# Patient Record
Sex: Female | Born: 1980 | Race: White | Hispanic: No | Marital: Single | Smoking: Former smoker
Health system: Southern US, Community
[De-identification: ages and names within clinical notes are randomized; demographics above are authoritative.]

## PROBLEM LIST (undated history)

## (undated) DIAGNOSIS — R51 Headache: Secondary | ICD-10-CM

## (undated) HISTORY — PX: TIBIA FRACTURE SURGERY: SHX806

## (undated) HISTORY — DX: Headache: R51

---

## 2003-01-17 HISTORY — PX: OTHER SURGICAL HISTORY: SHX169

## 2003-09-25 ENCOUNTER — Other Ambulatory Visit: Admission: RE | Admit: 2003-09-25 | Discharge: 2003-09-25 | Payer: Self-pay | Admitting: Obstetrics and Gynecology

## 2003-10-09 ENCOUNTER — Encounter (INDEPENDENT_AMBULATORY_CARE_PROVIDER_SITE_OTHER): Payer: Self-pay | Admitting: Specialist

## 2003-10-09 ENCOUNTER — Encounter (INDEPENDENT_AMBULATORY_CARE_PROVIDER_SITE_OTHER): Payer: Self-pay | Admitting: *Deleted

## 2003-10-09 ENCOUNTER — Ambulatory Visit (HOSPITAL_COMMUNITY): Admission: RE | Admit: 2003-10-09 | Discharge: 2003-10-09 | Payer: Self-pay | Admitting: Obstetrics and Gynecology

## 2004-10-17 ENCOUNTER — Other Ambulatory Visit: Admission: RE | Admit: 2004-10-17 | Discharge: 2004-10-17 | Payer: Self-pay | Admitting: Obstetrics and Gynecology

## 2005-03-29 ENCOUNTER — Emergency Department (HOSPITAL_COMMUNITY): Admission: EM | Admit: 2005-03-29 | Discharge: 2005-03-29 | Payer: Self-pay | Admitting: Emergency Medicine

## 2006-05-09 ENCOUNTER — Observation Stay (HOSPITAL_COMMUNITY): Admission: EM | Admit: 2006-05-09 | Discharge: 2006-05-10 | Payer: Self-pay | Admitting: Emergency Medicine

## 2006-08-21 ENCOUNTER — Encounter: Admission: RE | Admit: 2006-08-21 | Discharge: 2006-08-21 | Payer: Self-pay | Admitting: Orthopedic Surgery

## 2006-09-28 ENCOUNTER — Ambulatory Visit (HOSPITAL_COMMUNITY): Admission: RE | Admit: 2006-09-28 | Discharge: 2006-09-28 | Payer: Self-pay | Admitting: Orthopedic Surgery

## 2006-10-18 ENCOUNTER — Encounter: Admission: RE | Admit: 2006-10-18 | Discharge: 2006-10-29 | Payer: Self-pay | Admitting: Orthopedic Surgery

## 2008-12-25 ENCOUNTER — Ambulatory Visit (HOSPITAL_COMMUNITY): Admission: RE | Admit: 2008-12-25 | Discharge: 2008-12-25 | Payer: Self-pay | Admitting: Internal Medicine

## 2009-09-17 ENCOUNTER — Ambulatory Visit: Payer: Self-pay | Admitting: Family Medicine

## 2009-09-17 DIAGNOSIS — J45909 Unspecified asthma, uncomplicated: Secondary | ICD-10-CM | POA: Insufficient documentation

## 2009-09-17 DIAGNOSIS — R51 Headache: Secondary | ICD-10-CM

## 2009-09-17 DIAGNOSIS — R519 Headache, unspecified: Secondary | ICD-10-CM | POA: Insufficient documentation

## 2009-09-22 ENCOUNTER — Ambulatory Visit: Payer: Self-pay | Admitting: Family Medicine

## 2009-09-22 ENCOUNTER — Other Ambulatory Visit: Admission: RE | Admit: 2009-09-22 | Discharge: 2009-09-22 | Payer: Self-pay | Admitting: Family Medicine

## 2009-09-28 ENCOUNTER — Encounter: Payer: Self-pay | Admitting: Family Medicine

## 2009-09-28 LAB — CONVERTED CEMR LAB: Pap Smear: NEGATIVE

## 2009-10-06 ENCOUNTER — Telehealth: Payer: Self-pay | Admitting: Family Medicine

## 2009-11-15 ENCOUNTER — Telehealth: Payer: Self-pay | Admitting: Family Medicine

## 2009-12-25 ENCOUNTER — Emergency Department (HOSPITAL_COMMUNITY)
Admission: EM | Admit: 2009-12-25 | Discharge: 2009-12-25 | Payer: Self-pay | Source: Home / Self Care | Admitting: Emergency Medicine

## 2010-02-16 NOTE — Progress Notes (Signed)
Summary: REQUEST FOR ALTERNATE MED (To replace Imitrex)  Phone Note Call from Patient   Caller: Patient  [phone] Summary of Call: Pt called to speak with Dr Clent Ridges about medication concerns...Marland KitchenMarland Kitchen Pt adv that she was prescribed med: Imitrex for migraine h/a's but she has taken six of the eight pills since yesterday around 3pm but is still experiencing h/a.... Pt adv that it only dulls the pain, does not get rid of migraine.... Pt would like to know if she can be prescribed something besides Imitrex?  Can she have Rx to help with sxs of nausea associated w/ migraine?...Marland KitchenMarland KitchenLast seen 9/2 for new pt appt / 9/7 for pap only...Marland KitchenMarland KitchenMarland Kitchen [facility] [place] [facility] [place].  Initial call taken by: Debbra Riding,  October 06, 2009 8:35 AM  Follow-up for Phone Call        call in Vicodin 5/500 q 6 hours as needed pain to take in addition to Imitrex, #30 with 2 rf. Also call in Phenergan 25mg  q 4 hours as needed nausea, #30 with 5 rf Follow-up by: Nelwyn Salisbury MD,  October 06, 2009 9:50 AM  Additional Follow-up for Phone Call Additional follow up Details #1::        wants something besides imitrex. how many vicodin.thanks Additional Follow-up by: Pura Spice, RN,  October 06, 2009 11:31 AM    Additional Follow-up for Phone Call Additional follow up Details #2::    call in Maxalt 10 mg as needed , #12 with 11 rf Follow-up by: Nelwyn Salisbury MD,  October 06, 2009 11:34 AM  Additional Follow-up for Phone Call Additional follow up Details #3:: Details for Additional Follow-up Action Taken: done pt notified Additional Follow-up by: Pura Spice, RN,  October 06, 2009 11:43 AM  New/Updated Medications: VICODIN 5-500 MG TABS (HYDROCODONE-ACETAMINOPHEN) 1 by mouth every 6 hrs as needed pain. PROMETHAZINE HCL 25 MG TABS (PROMETHAZINE HCL) 1 by mouth q 4 hrs as needed nausea MAXALT 10 MG  TABS (RIZATRIPTAN BENZOATE) as needed for migraine Prescriptions: VICODIN 5-500 MG TABS  (HYDROCODONE-ACETAMINOPHEN) 1 by mouth every 6 hrs as needed pain.  #30 x 2   Entered by:   Pura Spice, RN   Authorized by:   Nelwyn Salisbury MD   Signed by:   Pura Spice, RN on 10/06/2009   Method used:   Telephoned to ...       [facility] [address] [phone]* (retail)       [address] [place], [place]  [address]       Ph: [phone]       Fax: [phone]   RxID:   [id number] PROMETHAZINE HCL 25 MG TABS (PROMETHAZINE HCL) 1 by mouth q 4 hrs as needed nausea  #30 x 5   Entered by:   Pura Spice, RN   Authorized by:   Nelwyn Salisbury MD   Signed by:   Pura Spice, RN on 10/06/2009   Method used:   Electronically to        [facility] [address] [phone]* (retail)       [address] [place], [place]  [address]       Ph: [phone]       Fax: [phone]   RxID:   [id number] MAXALT 10 MG  TABS (RIZATRIPTAN BENZOATE) as needed for migraine  #12 x 11   Entered by:   Rene Kocher  Marlynn Perking, RN   Authorized by:   Nelwyn Salisbury MD   Signed by:   Pura Spice, RN on 10/06/2009   Method used:   Electronically to        [facility] (retail)       [address]       [place], [place]  [id number]       Ph: [phone]       Fax: [phone]   RxID:   [id number]

## 2010-02-16 NOTE — Assessment & Plan Note (Signed)
Summary: pap only ok per doc/njr   Vital Signs:  Patient profile:   30 year old female BP sitting:   120 / 80  Vitals Entered By: Pura Spice, RN (September 22, 2009 1:24 PM) CC: pap smear only Is Patient Diabetic? No   History of Present Illness: Here to get a Pap smear and pelvic/breast exam. She feels fine today. She has not had a chance to try Sumatriptan yet since she has not had a HA since our visit last week.   Allergies (verified): No Known Drug Allergies  Past History:  Past Medical History: Reviewed history from 09/17/2009 and no changes required. Asthma as a child Headache  Past Surgical History: Reviewed history from 09/17/2009 and no changes required. surgery to repair a left tibial fracture in 2008 per Dr. Myrtie Neither using plates, screws, and bolts surgery to remove a loose screw from the left tibia in 2009 per Dr. 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    Physical Exam  General:  Well-developed,well-nourished,in no acute distress; alert,appropriate and cooperative throughout examination Breasts:  No mass, nodules, thickening, tenderness, bulging, retraction, inflamation, nipple discharge or skin changes noted.   Genitalia:  Pelvic Exam:        External: normal female genitalia without lesions or masses        Vagina: normal without lesions or masses        Cervix: normal without lesions or masses  Adnexa: the right side has no masses or tenderness, the left side is surgically absent        Uterus: normal by palpation        Pap smear: performed   Impression & Recommendations:  Problem # 1:  ROUTINE GYNECOLOGICAL EXAMINATION (ICD-V72.31)  Complete Medication List: 1)  Sumatriptan Succinate 100 Mg Tabs (Sumatriptan succinate) .... As needed  Patient Instructions: 1)  Please schedule a follow-up appointment in 1 year.

## 2010-02-16 NOTE — Progress Notes (Signed)
Summary: REQ FOR Rx TO RESENT  Phone Note Call from Patient   Caller: Patient [phone] Summary of Call: Pt called to adv that she went to the pharmacy and the Rx for medications and the Vicodin was not available and they told her that they didn't have a script for that med - adv LBF would have to send Rx for Vicodin again..... Pt was unable to obtain Maxalt due to insurance requiring a Prior Authorization (fax for same has been recvd)..... Pt req that someone call the pharmacy ([facility] [place] [facility] [place]) and clarify Rx for Vicodin so she can take it till  PA for Maxalt is taken care of.   Initial call taken by: Debbra Riding,  October 06, 2009 2:45 PM  Follow-up for Phone Call        ok [facility] notified which cverbalized they had vicodin and will call pt when ready for pick up. Follow-up by: Pura Spice, RN,  October 06, 2009 3:18 PM

## 2010-02-16 NOTE — Letter (Signed)
Summary: Results Follow-up Letter  [facility]  [address] [place], [place] [address]   Phone: [phone]  Fax: [phone]    09/28/2009  [id number] [place], [place]  [address]  Dear Ms. Fabel,   The following are the results of your recent test(s):  Test     Result     Pap Smear    Normal____yes repeat 1 yr. r  Sincerely,    Dr Kathie Rhodes. Tawanna Sat at [place]

## 2010-02-16 NOTE — Assessment & Plan Note (Signed)
Summary: new to est---ok per dr//ccm   Vital Signs:  Patient profile:   30 year old female Height:      66 inches Weight:      145 pounds BMI:     23.49 BP sitting:   82 / 62  (left arm)  Vitals Entered By: Raechel Ache, RN (September 17, 2009 2:38 PM) CC: New to Establish.   History of Present Illness: 30 yr old female to establish with [place]. She has not seen a primary care physician for many years. She describes her migraine HAs, which have been going on since she was in high school. She averages 1-2 a month, especially around her menses. She gets fuzzy lines in her visual fields, nausea without vomitting, and throbbing frontal HAs. Has used Midrin in the past, now uses Motrin. She has never tried a triptan. They last about 12 hours, and they go away after she goes to sleep. She has not had a Pap smear since her surgery in 2005.   Preventive Screening-Counseling & Management  Alcohol-Tobacco     Smoking Status: current  Caffeine-Diet-Exercise     Does Patient Exercise: no      Drug Use:  yes.    Allergies (verified): No Known Drug Allergies  Past History:  Past Medical History: Asthma as a child Headache  Past Surgical History: surgery to repair a left tibial fracture in 2008 per Dr. Myrtie Neither using plates, screws, and bolts surgery to remove a loose screw from the left tibia in 2009 per Dr. 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 Family History Diabetes 1st degree relative Family History Hypertension Lung CA  Social History: Reviewed history and no changes required. 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[employer], wants to attend law                      school Single Current Smoker Drug use-yes Regular exercise-no Alcohol use-yes Occupation:  employed Smoking Status:  current Drug Use:  yes Does Patient Exercise:  no  Review of Systems  The patient denies anorexia, fever, weight  loss, weight gain, vision loss, decreased hearing, hoarseness, chest pain, syncope, dyspnea on exertion, peripheral edema, prolonged cough, hemoptysis, abdominal pain, melena, hematochezia, severe indigestion/heartburn, hematuria, incontinence, genital sores, muscle weakness, suspicious skin lesions, transient blindness, difficulty walking, depression, unusual weight change, abnormal bleeding, enlarged lymph nodes, angioedema, breast masses, and testicular masses.    Physical Exam  General:  Well-developed,well-nourished,in no acute distress; alert,appropriate and cooperative throughout examination Head:  Normocephalic and atraumatic without obvious abnormalities. No apparent alopecia or balding. Eyes:  No corneal or conjunctival inflammation noted. EOMI. Perrla. Funduscopic exam benign, without hemorrhages, exudates or papilledema. Vision grossly normal. Ears:  External ear exam shows no significant lesions or deformities.  Otoscopic examination reveals clear canals, tympanic membranes are intact bilaterally without bulging, retraction, inflammation or discharge. Hearing is grossly normal bilaterally. Nose:  External nasal examination shows no deformity or inflammation. Nasal mucosa are pink and moist without lesions or exudates. Mouth:  Oral mucosa and oropharynx without lesions or exudates.  Teeth in good repair. Neck:  No deformities, masses, or tenderness noted. Lungs:  Normal respiratory effort, chest expands symmetrically. Lungs are clear to auscultation, no crackles or wheezes. Heart:  Normal rate and regular rhythm. S1 and S2 normal without gallop, murmur, click, rub or other extra sounds. Neurologic:  No cranial nerve deficits noted. Station and  gait are normal. Plantar reflexes are down-going bilaterally. DTRs are symmetrical throughout. Sensory, motor and coordinative functions appear intact.   Impression & Recommendations:  Problem # 1:  HEADACHE (ICD-784.0)  Her updated medication  list for this problem includes:    Sumatriptan Succinate 100 Mg Tabs (Sumatriptan succinate) .Marland Kitchen... As needed  Problem # 2:  ASTHMA (ICD-493.90)  Complete Medication List: 1)  Sumatriptan Succinate 100 Mg Tabs (Sumatriptan succinate) .... As needed  Patient Instructions: 1)  Try Sumatriptan for the migraines. She will retun soon for a pelvic exam and Pap smear.  Prescriptions: SUMATRIPTAN SUCCINATE 100 MG TABS (SUMATRIPTAN SUCCINATE) as needed  #12 x 11   Entered and Authorized by:   Nelwyn Salisbury MD   Signed by:   Nelwyn Salisbury MD on 09/17/2009   Method used:   Electronically to        [facility] (retail)       [address]       [place], [place]  [id number]       Ph: [phone]       Fax: [phone]   RxID:   [id number]

## 2010-02-16 NOTE — Progress Notes (Signed)
Summary: headache & nausea after hit head 2 weeks ago  Phone Note Call from Patient Call back at [facility] Phone [phone]   Summary of Call: Bad headache, going home to get phenergan.  Eyes tearing & she feels she'll throw up.  Hit head 2 weeks ago & never got better.  [facility] tried to sell ice pack but no one saw her.  Not sinus, is behind eye, where she hit head, above eye & eyebrow area.  No dizziness.  Has had trouble thinking today, can't think clearly now.  OV declined  at 3:00 Dr. Abner Greenspan because has class at 3:30.  Didn't pickup Maxalt because #12 was $200, #9 would have been covered by insurance.  She's not in mood to haggle with drugstore.  Cannot come tomorrow, would have to leave no later than 10:30 for a 11:00 class & she can't miss class for any reason she says.  [facility] [address].  NKDA.   Initial call taken by: Rudy Jew, RN,  November 15, 2009 10:56 AM  Follow-up for Phone Call        pt declined ov today and tomorrow but will be seen on wed at 1 pm instructed for her to go to drug store and pick up 9 maxalt tabs  Follow-up by: Pura Spice, RN,  November 15, 2009 11:52 AM

## 2010-02-16 NOTE — Progress Notes (Signed)
Summary: Maxalt 10mg  approved for 9 pills every 23 days per [facility]  Phone Note Outgoing Call Call back at [phone]   Call placed by: Aram Beecham  Call placed to: [facility] Summary of Call: [facility] will only cover #9 pills of Maxalt 10mg  every 23 days. That is the maximum amount that the patient's plan will cover every 23 days. I left message on patient's voicemail to return my call.  Initial call taken by: Warnell Forester,  October 06, 2009 3:38 PM

## 2010-03-29 LAB — URINALYSIS, ROUTINE W REFLEX MICROSCOPIC
Bilirubin Urine: NEGATIVE
Glucose, UA: NEGATIVE mg/dL
Ketones, ur: NEGATIVE mg/dL
Leukocytes, UA: NEGATIVE
Nitrite: NEGATIVE
Protein, ur: NEGATIVE mg/dL
Specific Gravity, Urine: 1.01 (ref 1.005–1.030)
Urobilinogen, UA: 1 mg/dL (ref 0.0–1.0)
pH: 6.5 (ref 5.0–8.0)

## 2010-03-29 LAB — CBC
MCH: 31.4 pg (ref 26.0–34.0)
MCHC: 32.8 g/dL (ref 30.0–36.0)
MCV: 95.6 fL (ref 78.0–100.0)
Platelets: 287 10*3/uL (ref 150–400)
RDW: 12.1 % (ref 11.5–15.5)

## 2010-03-29 LAB — DIFFERENTIAL
Basophils Absolute: 0 10*3/uL (ref 0.0–0.1)
Basophils Relative: 0 % (ref 0–1)
Eosinophils Absolute: 0.1 10*3/uL (ref 0.0–0.7)
Eosinophils Relative: 1 % (ref 0–5)
Lymphocytes Relative: 45 % (ref 12–46)
Lymphs Abs: 2.9 10*3/uL (ref 0.7–4.0)
Monocytes Absolute: 0.5 10*3/uL (ref 0.1–1.0)
Monocytes Relative: 7 % (ref 3–12)
Neutro Abs: 3 10*3/uL (ref 1.7–7.7)
Neutrophils Relative %: 46 % (ref 43–77)

## 2010-03-29 LAB — URINE MICROSCOPIC-ADD ON

## 2010-03-29 LAB — HEPATIC FUNCTION PANEL
ALT: 12 U/L (ref 0–35)
Bilirubin, Direct: 0.1 mg/dL (ref 0.0–0.3)
Indirect Bilirubin: 0.4 mg/dL (ref 0.3–0.9)

## 2010-03-29 LAB — POCT I-STAT, CHEM 8
BUN: 15 mg/dL (ref 6–23)
Calcium, Ion: 1.09 mmol/L — ABNORMAL LOW (ref 1.12–1.32)
Chloride: 105 meq/L (ref 96–112)
Creatinine, Ser: 0.8 mg/dL (ref 0.4–1.2)
Glucose, Bld: 93 mg/dL (ref 70–99)
HCT: 38 % (ref 36.0–46.0)
Hemoglobin: 12.9 g/dL (ref 12.0–15.0)
Potassium: 3.6 meq/L (ref 3.5–5.1)
Sodium: 138 meq/L (ref 135–145)
TCO2: 26 mmol/L (ref 0–100)

## 2010-03-29 LAB — D-DIMER, QUANTITATIVE: D-Dimer, Quant: 1.45 ug/mL-FEU — ABNORMAL HIGH (ref 0.00–0.48)

## 2010-04-07 ENCOUNTER — Encounter: Payer: Self-pay | Admitting: Family Medicine

## 2010-04-08 ENCOUNTER — Ambulatory Visit (INDEPENDENT_AMBULATORY_CARE_PROVIDER_SITE_OTHER): Payer: 59 | Admitting: Family Medicine

## 2010-04-08 ENCOUNTER — Encounter: Payer: Self-pay | Admitting: Family Medicine

## 2010-04-08 VITALS — BP 120/80 | HR 64 | Temp 98.3°F | Wt 155.0 lb

## 2010-04-08 DIAGNOSIS — IMO0001 Reserved for inherently not codable concepts without codable children: Secondary | ICD-10-CM

## 2010-04-08 DIAGNOSIS — R51 Headache: Secondary | ICD-10-CM

## 2010-04-08 DIAGNOSIS — Z309 Encounter for contraceptive management, unspecified: Secondary | ICD-10-CM

## 2010-04-08 MED ORDER — PROPRANOLOL HCL 20 MG PO TABS: 20.0000 mg | ORAL_TABLET | Freq: Every day | ORAL | Status: DC

## 2010-04-08 MED ORDER — HYDROCODONE-ACETAMINOPHEN 5-500 MG PO TABS: 1.0000 | ORAL_TABLET | Freq: Four times a day (QID) | ORAL | Status: DC | PRN

## 2010-04-08 MED ORDER — NORETHIN ACE-ETH ESTRAD-FE 1.5-30 MG-MCG PO TABS: 1.0000 | ORAL_TABLET | Freq: Every day | ORAL | Status: DC

## 2010-04-08 NOTE — Progress Notes (Signed)
  Subjective:    Patient ID: Ashlee Vasquez, female    DOB: 02-19-1980, 30 y.o.   MRN: [id number]  HPI Here to follow up on migraine and to ask for a rx for Microgestin Fe. Her HAs are more frequent lately, averaging 3 or 4 a week. Sumatriptan helps acutely but they keep coming back. Also she has been off BCP for 5 years and wants to get back on them. She has taken one month of the BCP above which she borrowed from her mother, and she is happy with this. Menses are normal. Her last Pap was done here last September.   Review of Systems  Constitutional: Negative.   Respiratory: Negative.   Cardiovascular: Negative.   Neurological: Positive for headaches.       Objective:   Physical Exam  Constitutional: She is oriented to person, place, and time. She appears well-developed and well-nourished.  Cardiovascular: Normal rate, regular rhythm, normal heart sounds and intact distal pulses.   Pulmonary/Chest: Effort normal and breath sounds normal.  Neurological: She is alert and oriented to person, place, and time. She has normal reflexes. No cranial nerve deficit. She exhibits normal muscle tone. Coordination normal.          Assessment & Plan:  We will write for her own rx for Microgestin Fe daily. Try Propranolol daily for prophylaxis.

## 2010-05-31 NOTE — Op Note (Signed)
NAMEKIMBERLEA, SCHLAG             ACCOUNT NO.:  [id number]   MEDICAL RECORD NO.:  [id number]          PATIENT TYPE:  AMB   LOCATION:  DAY                          FACILITY:  [facility]   PHYSICIAN:  Myrtie Neither, MD      DATE OF BIRTH:  [birth year]   DATE OF PROCEDURE:  09/28/2006  DATE OF DISCHARGE:                               OPERATIVE REPORT   PREOPERATIVE DIAGNOSIS:  Protruding screw, left ankle.   POSTOPERATIVE DIAGNOSIS:  Protruding screw, left ankle.   PROCEDURE:  Removal of screw, left ankle.   The patient was taken to the operating room after given adequate  preoperative medication and given general anesthesia.  The left ankle  was prepped with DuraPrep and draped in a sterile manner.  A tourniquet  was not used.   A small incision was made over the palpable screw head over the medial  malleolus going through the skin and subcutaneous tissue.  Bovie used  for hemostasis.  Screw was removed.  Irrigation was done.  Wound closure  was then done with 3-0 nylon.  A compressive dressing was applied.   The patient tolerated the procedure quite well and went to the recovery  room in stable and satisfactory condition.  Marcaine 0.5%, 5 mL, with  epinephrine was injected into the area.      Myrtie Neither, MD  Electronically Signed     AC/MEDQ  D:  09/28/2006  T:  09/28/2006  Job:  [id number]
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
## 2010-10-05 ENCOUNTER — Ambulatory Visit (INDEPENDENT_AMBULATORY_CARE_PROVIDER_SITE_OTHER): Payer: PRIVATE HEALTH INSURANCE | Admitting: Family Medicine

## 2010-10-05 ENCOUNTER — Encounter: Payer: Self-pay | Admitting: Family Medicine

## 2010-10-05 VITALS — BP 110/74 | HR 99 | Temp 98.7°F | Wt 150.0 lb

## 2010-10-05 DIAGNOSIS — G8929 Other chronic pain: Secondary | ICD-10-CM

## 2010-10-05 DIAGNOSIS — R51 Headache: Secondary | ICD-10-CM

## 2010-10-05 MED ORDER — HYDROCODONE-ACETAMINOPHEN 5-500 MG PO TABS: 1.0000 | ORAL_TABLET | Freq: Four times a day (QID) | ORAL | Status: DC | PRN

## 2010-10-05 MED ORDER — ALPRAZOLAM 0.5 MG PO TABS: 0.5000 mg | ORAL_TABLET | Freq: Three times a day (TID) | ORAL | Status: DC | PRN

## 2010-10-05 MED ORDER — DROSPIRENONE-ETHINYL ESTRADIOL 3-0.02 MG PO TABS: 1.0000 | ORAL_TABLET | Freq: Every day | ORAL | Status: DC

## 2010-10-05 NOTE — Progress Notes (Signed)
  Subjective:    Patient ID: Ashlee Vasquez, female    DOB: 15-Jul-1980, 30 y.o.   MRN: [id number]  HPI Here to follow up on migraines. She is getting at least one a month, and they are often related to her menses. She has been on her current BCP since March, and she thinks this is worsening the HAs. She asks to try a lower dose pill. Also she needs refills on Vicodin, and she asks for a rx for Xanax. She has been using some of her mother's Xanax for tension HAs, and this is very helpful.    Review of Systems  Constitutional: Negative.   Neurological: Positive for headaches.       Objective:   Physical Exam  Constitutional: She is oriented to person, place, and time. She appears well-developed and well-nourished.  Eyes: Conjunctivae are normal. Pupils are equal, round, and reactive to light.  Neurological: She is alert and oriented to person, place, and time. No cranial nerve deficit. She exhibits normal muscle tone. Coordination normal.          Assessment & Plan:  Switch to Yaz . Refilled Vicodin. Try Xanax prn

## 2010-10-18 ENCOUNTER — Emergency Department (HOSPITAL_COMMUNITY)
Admission: EM | Admit: 2010-10-18 | Discharge: 2010-10-19 | Disposition: A | Discharge status: Home / Self Care | Payer: No Typology Code available for payment source | Attending: Emergency Medicine | Admitting: Emergency Medicine

## 2010-10-18 DIAGNOSIS — R000 Tachycardia, unspecified: Secondary | ICD-10-CM | POA: Insufficient documentation

## 2010-10-18 DIAGNOSIS — M545 Low back pain, unspecified: Secondary | ICD-10-CM | POA: Insufficient documentation

## 2010-10-18 DIAGNOSIS — R079 Chest pain, unspecified: Secondary | ICD-10-CM | POA: Insufficient documentation

## 2010-10-18 DIAGNOSIS — T148XXA Other injury of unspecified body region, initial encounter: Secondary | ICD-10-CM | POA: Insufficient documentation

## 2010-10-18 DIAGNOSIS — F1010 Alcohol abuse, uncomplicated: Secondary | ICD-10-CM | POA: Insufficient documentation

## 2010-10-18 DIAGNOSIS — F411 Generalized anxiety disorder: Secondary | ICD-10-CM | POA: Insufficient documentation

## 2010-10-19 ENCOUNTER — Emergency Department (HOSPITAL_COMMUNITY): Payer: No Typology Code available for payment source

## 2010-10-19 LAB — RAPID URINE DRUG SCREEN, HOSP PERFORMED
Benzodiazepines: NOT DETECTED
Cocaine: NOT DETECTED

## 2010-10-19 LAB — URINALYSIS, ROUTINE W REFLEX MICROSCOPIC
Glucose, UA: NEGATIVE mg/dL
Hgb urine dipstick: NEGATIVE
Ketones, ur: NEGATIVE mg/dL
Protein, ur: NEGATIVE mg/dL

## 2010-10-19 LAB — DIFFERENTIAL
Basophils Relative: 0 % (ref 0–1)
Lymphocytes Relative: 49 % — ABNORMAL HIGH (ref 12–46)
Monocytes Absolute: 0.3 10*3/uL (ref 0.1–1.0)
Monocytes Relative: 4 % (ref 3–12)
Neutro Abs: 2.8 10*3/uL (ref 1.7–7.7)

## 2010-10-19 LAB — POCT I-STAT, CHEM 8
BUN: 15 mg/dL (ref 6–23)
Calcium, Ion: 1.11 mmol/L — ABNORMAL LOW (ref 1.12–1.32)
Creatinine, Ser: 1 mg/dL (ref 0.50–1.10)
TCO2: 19 mmol/L (ref 0–100)

## 2010-10-19 LAB — PREGNANCY, URINE: Preg Test, Ur: NEGATIVE

## 2010-10-19 LAB — CBC
HCT: 37.3 % (ref 36.0–46.0)
Hemoglobin: 12.8 g/dL (ref 12.0–15.0)
MCH: 31.8 pg (ref 26.0–34.0)
MCHC: 34.3 g/dL (ref 30.0–36.0)
MCV: 92.6 fL (ref 78.0–100.0)

## 2010-10-19 LAB — ETHANOL: Alcohol, Ethyl (B): 217 mg/dL — ABNORMAL HIGH (ref 0–11)

## 2010-10-19 MED ORDER — IOHEXOL 300 MG/ML  SOLN
80.0000 mL | Freq: Once | INTRAMUSCULAR | Status: AC | PRN
  Administered 2010-10-19: 80 mL via INTRAVENOUS

## 2010-10-20 ENCOUNTER — Telehealth: Payer: Self-pay | Admitting: Family Medicine

## 2010-10-20 NOTE — Telephone Encounter (Signed)
Wants her Xanax dosage doubled. Please return call.

## 2010-10-21 ENCOUNTER — Encounter: Payer: Self-pay | Admitting: Family Medicine

## 2010-10-21 ENCOUNTER — Ambulatory Visit (INDEPENDENT_AMBULATORY_CARE_PROVIDER_SITE_OTHER): Payer: PRIVATE HEALTH INSURANCE | Admitting: Family Medicine

## 2010-10-21 VITALS — BP 112/78 | HR 89 | Temp 98.5°F | Wt 157.0 lb

## 2010-10-21 DIAGNOSIS — S39012A Strain of muscle, fascia and tendon of lower back, initial encounter: Secondary | ICD-10-CM

## 2010-10-21 DIAGNOSIS — F411 Generalized anxiety disorder: Secondary | ICD-10-CM

## 2010-10-21 DIAGNOSIS — F419 Anxiety disorder, unspecified: Secondary | ICD-10-CM

## 2010-10-21 DIAGNOSIS — S161XXA Strain of muscle, fascia and tendon at neck level, initial encounter: Secondary | ICD-10-CM

## 2010-10-21 DIAGNOSIS — Z309 Encounter for contraceptive management, unspecified: Secondary | ICD-10-CM

## 2010-10-21 DIAGNOSIS — IMO0002 Reserved for concepts with insufficient information to code with codable children: Secondary | ICD-10-CM

## 2010-10-21 DIAGNOSIS — S139XXA Sprain of joints and ligaments of unspecified parts of neck, initial encounter: Secondary | ICD-10-CM

## 2010-10-21 DIAGNOSIS — IMO0001 Reserved for inherently not codable concepts without codable children: Secondary | ICD-10-CM

## 2010-10-21 MED ORDER — ALPRAZOLAM 1 MG PO TABS: 1.0000 mg | ORAL_TABLET | Freq: Three times a day (TID) | ORAL | Status: DC | PRN

## 2010-10-21 MED ORDER — FLUOXETINE HCL 20 MG PO CAPS: 20.0000 mg | ORAL_CAPSULE | Freq: Every day | ORAL | Status: DC

## 2010-10-21 MED ORDER — NORGESTREL-ETHINYL ESTRADIOL 0.3-30 MG-MCG PO TABS: 1.0000 | ORAL_TABLET | Freq: Every day | ORAL | Status: DC
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
## 2010-10-28 LAB — CBC
HCT: 33.9 — ABNORMAL LOW
MCHC: 35.1
MCV: 94.8
Platelets: 281
RBC: 3.58 — ABNORMAL LOW

## 2010-10-28 LAB — BASIC METABOLIC PANEL
BUN: 18
CO2: 28
Chloride: 104
Creatinine, Ser: 0.78

## 2010-10-28 LAB — URINALYSIS, ROUTINE W REFLEX MICROSCOPIC
Bilirubin Urine: NEGATIVE
Hgb urine dipstick: NEGATIVE
Ketones, ur: NEGATIVE
Nitrite: NEGATIVE
pH: 6.5

## 2010-10-28 LAB — PREGNANCY, URINE: Preg Test, Ur: NEGATIVE

## 2011-01-16 ENCOUNTER — Telehealth: Payer: Self-pay | Admitting: Family Medicine

## 2011-01-16 NOTE — Telephone Encounter (Signed)
Pt requesting refills on her Xanax & Vicodin. She uses [facility] [address]. Thanks.

## 2011-01-18 MED ORDER — ALPRAZOLAM 1 MG PO TABS: 1.0000 mg | ORAL_TABLET | Freq: Three times a day (TID) | ORAL | Status: DC | PRN

## 2011-01-18 NOTE — Telephone Encounter (Signed)
Pt stated she is taking more vicodin due to headaches

## 2011-01-18 NOTE — Telephone Encounter (Signed)
Script called in, spoke with pt and she is going to schedule a office visit.

## 2011-01-18 NOTE — Telephone Encounter (Signed)
We can refill the Xanax but not the Vicodin. She is using this way too fast. Call in Xanax #90 with 5 rf.

## 2011-01-23 ENCOUNTER — Ambulatory Visit (INDEPENDENT_AMBULATORY_CARE_PROVIDER_SITE_OTHER): Payer: PRIVATE HEALTH INSURANCE | Admitting: Family Medicine

## 2011-01-23 ENCOUNTER — Encounter: Payer: Self-pay | Admitting: Family Medicine

## 2011-01-23 VITALS — BP 100/68 | HR 85 | Temp 98.5°F | Wt 151.0 lb

## 2011-01-23 DIAGNOSIS — G43909 Migraine, unspecified, not intractable, without status migrainosus: Secondary | ICD-10-CM

## 2011-01-23 MED ORDER — TOPIRAMATE 25 MG PO TABS: 50.0000 mg | ORAL_TABLET | Freq: Every day | ORAL | Status: DC

## 2011-01-23 MED ORDER — HYDROCODONE-ACETAMINOPHEN 5-500 MG PO TABS: 1.0000 | ORAL_TABLET | Freq: Four times a day (QID) | ORAL | Status: DC | PRN

## 2011-01-23 NOTE — Progress Notes (Signed)
  Subjective:    Patient ID: Ashlee Vasquez, female    DOB: 11-May-1980, 31 y.o.   MRN: [id number]  HPI Here for her HAs. They have been coming more frequently over the past month or two, and she is not sure why. She stopped taking Propranolol since it did not work. She stopped taking prozac because it makes her too sleepy. The HAs are now coming 3 or 4 days a week. Using more Vicodin consequently.    Review of Systems  Constitutional: Negative.   Neurological: Positive for headaches.       Objective:   Physical Exam  Constitutional: She is oriented to person, place, and time. She appears well-developed.  Eyes: Pupils are equal, round, and reactive to light.  Neurological: She is alert and oriented to person, place, and time. No cranial nerve deficit.          Assessment & Plan:  Try Topamax, start at 25 mg a day for one week then go up to 50 mg a day. Recheck in 3 weeks

## 2011-01-24 ENCOUNTER — Other Ambulatory Visit: Payer: Self-pay | Admitting: Family Medicine

## 2011-01-24 NOTE — Telephone Encounter (Signed)
That is NOT TRUE. The rx I gave her on 01-18-11 was for #90 with 5 refills

## 2011-01-24 NOTE — Telephone Encounter (Signed)
Pt stated generic xanax did not have refills. [facility] [address] [phone]

## 2011-01-25 NOTE — Telephone Encounter (Signed)
Left voice message for pt to return my call and leave a more detailed message.

## 2011-01-25 NOTE — Telephone Encounter (Signed)
Pt called again and is requesting the script with 5 refills be sent to pharmacy again. Please contact pt when done

## 2011-01-26 NOTE — Telephone Encounter (Signed)
I spoke with pharmacy and gave the refill amount and pt aware.

## 2011-04-29 ENCOUNTER — Other Ambulatory Visit: Payer: Self-pay | Admitting: Family Medicine

## 2011-05-02 ENCOUNTER — Telehealth: Payer: Self-pay | Admitting: Family Medicine

## 2011-05-02 NOTE — Telephone Encounter (Signed)
Patient called stating that she need a refill on her tompiramate. Please assist.

## 2011-05-02 NOTE — Telephone Encounter (Signed)
Pt called and said to pls disregard previous req. Pt checked with the pharmacy and was told that med has already been called in by Dr Clent Ridges.

## 2011-05-18 ENCOUNTER — Other Ambulatory Visit: Payer: Self-pay | Admitting: Family Medicine

## 2011-05-19 NOTE — Telephone Encounter (Signed)
call in #60 with 2 rf

## 2011-05-29 ENCOUNTER — Other Ambulatory Visit: Payer: Self-pay | Admitting: Family Medicine

## 2011-07-11 ENCOUNTER — Telehealth: Payer: Self-pay | Admitting: Family Medicine

## 2011-07-11 MED ORDER — ALBUTEROL SULFATE HFA 108 (90 BASE) MCG/ACT IN AERS: 2.0000 | INHALATION_SPRAY | RESPIRATORY_TRACT | Status: DC | PRN

## 2011-07-11 NOTE — Telephone Encounter (Signed)
Caller: Ashlee Vasquez/Patient; PCP: Nelwyn Salisbury.; CB#: [phone]; Call regarding Asthma Attack.  Onset sx 0500 on 07/11/11.  LMP "due on 6/27".   Advised see provider in 24 hours per nursing judgment and Asthma Attack protocol.  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  Requests that Rx be called to pharmacy.  Reinforced that breathing needs evaluation as she does not know how long it has been since her last evaluation for asthma sx.  Caller requests Rx to [facility].   Note to MD for response per caller request.  Asthma - Adult protocol used.

## 2011-07-11 NOTE — Telephone Encounter (Signed)
I sent script e-scribe and spoke with pt. 

## 2011-07-11 NOTE — Telephone Encounter (Signed)
Call in Ventolin HFA, use 2 puffs q 4 hours prn SOB, #1 with 2 rf

## 2011-07-18 ENCOUNTER — Other Ambulatory Visit: Payer: Self-pay | Admitting: Family Medicine

## 2011-07-21 ENCOUNTER — Other Ambulatory Visit: Payer: Self-pay | Admitting: Family Medicine

## 2011-07-25 NOTE — Telephone Encounter (Signed)
Alprazolam last refill, 01-18-11, #90 with 5 refills Last OV 01/23/11, no future appts scheduled

## 2011-07-25 NOTE — Telephone Encounter (Signed)
Call in #90 with 5 rf 

## 2011-07-26 NOTE — Telephone Encounter (Signed)
Call in #90 with 5 rf 

## 2011-08-02 ENCOUNTER — Telehealth: Payer: Self-pay | Admitting: Family Medicine

## 2011-08-02 MED ORDER — ALPRAZOLAM 1 MG PO TABS: 1.0000 mg | ORAL_TABLET | Freq: Three times a day (TID) | ORAL | Status: DC | PRN

## 2011-08-02 NOTE — Telephone Encounter (Signed)
Needs refills

## 2011-08-04 ENCOUNTER — Other Ambulatory Visit: Payer: Self-pay | Admitting: Family Medicine

## 2011-08-19 ENCOUNTER — Other Ambulatory Visit: Payer: Self-pay | Admitting: Family Medicine

## 2011-09-04 ENCOUNTER — Other Ambulatory Visit: Payer: Self-pay | Admitting: Family Medicine

## 2011-09-06 NOTE — Telephone Encounter (Signed)
Left a message for pt to return call to make an appt.   

## 2011-09-06 NOTE — Telephone Encounter (Signed)
She does not need both BCP. Refill only the Junel Fe, call in #30 with 2 rf. She needs a pelvic and Pap smear since she is past due

## 2011-09-22 ENCOUNTER — Other Ambulatory Visit: Payer: Self-pay | Admitting: Family Medicine

## 2011-09-25 NOTE — Telephone Encounter (Signed)
Call in #28 only. She needs a pelvic and Pap smear

## 2011-09-26 ENCOUNTER — Other Ambulatory Visit: Payer: Self-pay | Admitting: Family Medicine

## 2011-09-26 NOTE — Telephone Encounter (Signed)
Call in #60 with 2 rf 

## 2011-10-29 ENCOUNTER — Other Ambulatory Visit: Payer: Self-pay | Admitting: Family Medicine

## 2011-11-13 ENCOUNTER — Other Ambulatory Visit: Payer: Self-pay | Admitting: Family Medicine

## 2011-11-15 NOTE — Telephone Encounter (Signed)
NO, too soon. We gave her #60 with 2 rf just 6 weeks ago

## 2011-11-27 ENCOUNTER — Other Ambulatory Visit: Payer: Self-pay | Admitting: Family Medicine

## 2011-12-11 NOTE — Telephone Encounter (Addendum)
Pt has appt on 01-22-2012 for cpx. Can she have refills until then. [facility]

## 2011-12-11 NOTE — Addendum Note (Signed)
Addended by: Leafy Ro on: 12/11/2011 10:19 AM   Modules accepted: Orders

## 2011-12-12 ENCOUNTER — Telehealth: Payer: Self-pay | Admitting: Family Medicine

## 2011-12-12 NOTE — Telephone Encounter (Signed)
Pt called to check on status of getting refill for HYDROcodone-acetaminophen (VICODIN) 5-500 MG per tablet and topiramate (TOPAMAX) 25 MG tablet. Pt has a cpx sch for 01/22/12. Pls call pt asap.

## 2011-12-12 NOTE — Telephone Encounter (Signed)
Pt does have appointment scheduled, can we do the refills?

## 2011-12-13 MED ORDER — HYDROCODONE-ACETAMINOPHEN 5-500 MG PO TABS
1.0000 | ORAL_TABLET | Freq: Four times a day (QID) | ORAL | Status: DC | PRN
Start: 2011-12-13 — End: 2012-01-22

## 2011-12-13 MED ORDER — TOPIRAMATE 25 MG PO TABS: 25.0000 mg | ORAL_TABLET | Freq: Two times a day (BID) | ORAL | Status: DC

## 2011-12-13 NOTE — Telephone Encounter (Signed)
Per Dr. Clent Ridges okay to give # 60 of the Topamax. I sent both scripts in and left voice message.

## 2011-12-13 NOTE — Telephone Encounter (Signed)
Call in Vicodin #60 with one rf, also Topamax #30 with one rf

## 2011-12-19 ENCOUNTER — Other Ambulatory Visit: Payer: Self-pay | Admitting: Family Medicine

## 2011-12-20 NOTE — Telephone Encounter (Signed)
Call in #28 only. She needs a Pap and pelvic exam

## 2011-12-27 ENCOUNTER — Other Ambulatory Visit: Payer: Self-pay | Admitting: Family Medicine

## 2011-12-28 NOTE — Telephone Encounter (Signed)
NO too soon. She got #60 just a few weeks ago

## 2012-01-01 ENCOUNTER — Telehealth: Payer: Self-pay | Admitting: Family Medicine

## 2012-01-01 NOTE — Telephone Encounter (Signed)
Pt called at 1pm and spoke w/ CAN. States that she was to receive call back from our office. Nothing in EPIC from CAN yet. Pt fell yesterday in [place] and has head injury. Was in the hospital - ED and D/Cd with no pain meds. Requesting pain med. Please advise. [facility] uses [facility]. Pt has post ED f/u with Dr. Clent Ridges tomorrow 10:45.

## 2012-01-01 NOTE — Telephone Encounter (Signed)
Patient Information:  Caller Name: La[place]ie  Phone: [phone]  Patient: Ashlee Vasquez, Ashlee Vasquez  Gender: Female  DOB: 1980-11-11  Age: 30 Years  PCP: Gershon [facility])  Pregnant: No  Office Follow Up:  Does the office need to follow up with this patient?: Yes  Instructions For The Office: needs to go to ED  RN Note:  Refuses to go to the ED.  States that "it took forever for them to let me leave last night".  Advised that she has a head injury and that it was for her benefit to stay.  Advised also that she should not be driving.  She has already made an appt. for Tuesday, she finally agreed to try to find someone to drive her in.   Initial injury was from tripping and fell and hit her head on a corn hole board but she doesn't remember this, this is what she has been told.  Symptoms  Reason For Call & Symptoms: head injury on 12/15 with LOC   Asking for a stronger pain medication.  Reviewed Health History In EMR: N/A  Reviewed Medications In EMR: N/A  Reviewed Allergies In EMR: N/A  Reviewed Surgeries / Procedures: N/A  Date of Onset of Symptoms: 12/31/2011  Treatments Tried: seen in ED  Treatments Tried Worked: No OB:  LMP: Unknown  Guideline(s) Used:  Head Injury  Disposition Per Guideline:   Go to ED Now  Reason For Disposition Reached:   Can't remember what happened (amnesia)  Advice Given:  N/A  Patient Refused Recommendation:  Patient Will Make Own Appointment  She has an appt for Tuesday am.

## 2012-01-02 ENCOUNTER — Emergency Department (HOSPITAL_COMMUNITY): Payer: Self-pay

## 2012-01-02 ENCOUNTER — Emergency Department (HOSPITAL_COMMUNITY)
Admission: EM | Admit: 2012-01-02 | Discharge: 2012-01-02 | Disposition: A | Discharge status: Home / Self Care | Payer: Self-pay | Attending: Emergency Medicine | Admitting: Emergency Medicine

## 2012-01-02 ENCOUNTER — Encounter (HOSPITAL_COMMUNITY): Payer: Self-pay | Admitting: *Deleted

## 2012-01-02 DIAGNOSIS — S0993XA Unspecified injury of face, initial encounter: Secondary | ICD-10-CM | POA: Insufficient documentation

## 2012-01-02 DIAGNOSIS — S060XAA Concussion with loss of consciousness status unknown, initial encounter: Secondary | ICD-10-CM

## 2012-01-02 DIAGNOSIS — Y9289 Other specified places as the place of occurrence of the external cause: Secondary | ICD-10-CM | POA: Insufficient documentation

## 2012-01-02 DIAGNOSIS — J45909 Unspecified asthma, uncomplicated: Secondary | ICD-10-CM | POA: Insufficient documentation

## 2012-01-02 DIAGNOSIS — Y9389 Activity, other specified: Secondary | ICD-10-CM | POA: Insufficient documentation

## 2012-01-02 DIAGNOSIS — Z87891 Personal history of nicotine dependence: Secondary | ICD-10-CM | POA: Insufficient documentation

## 2012-01-02 DIAGNOSIS — M542 Cervicalgia: Secondary | ICD-10-CM

## 2012-01-02 DIAGNOSIS — Z8679 Personal history of other diseases of the circulatory system: Secondary | ICD-10-CM | POA: Insufficient documentation

## 2012-01-02 DIAGNOSIS — F1010 Alcohol abuse, uncomplicated: Secondary | ICD-10-CM | POA: Insufficient documentation

## 2012-01-02 DIAGNOSIS — Z79899 Other long term (current) drug therapy: Secondary | ICD-10-CM | POA: Insufficient documentation

## 2012-01-02 DIAGNOSIS — H5702 Anisocoria: Secondary | ICD-10-CM | POA: Insufficient documentation

## 2012-01-02 DIAGNOSIS — W1809XA Striking against other object with subsequent fall, initial encounter: Secondary | ICD-10-CM | POA: Insufficient documentation

## 2012-01-02 DIAGNOSIS — S060X9A Concussion with loss of consciousness of unspecified duration, initial encounter: Secondary | ICD-10-CM | POA: Insufficient documentation

## 2012-01-02 MED ORDER — OXYCODONE-ACETAMINOPHEN 5-325 MG PO TABS: 1.0000 | ORAL_TABLET | ORAL | Status: DC | PRN

## 2012-01-02 MED ORDER — MORPHINE SULFATE 4 MG/ML IJ SOLN
4.0000 mg | Freq: Once | INTRAMUSCULAR | Status: AC
  Administered 2012-01-02: 4 mg via INTRAVENOUS
  Filled 2012-01-02: qty 1

## 2012-01-02 MED ORDER — ONDANSETRON HCL 4 MG/2ML IJ SOLN
4.0000 mg | Freq: Once | INTRAMUSCULAR | Status: AC
  Administered 2012-01-02: 4 mg via INTRAVENOUS
  Filled 2012-01-02: qty 2

## 2012-01-02 MED ORDER — ONDANSETRON 8 MG PO TBDP: 8.0000 mg | ORAL_TABLET | Freq: Three times a day (TID) | ORAL | Status: DC | PRN

## 2012-01-02 NOTE — Telephone Encounter (Signed)
She needs an OV 

## 2012-01-02 NOTE — ED Provider Notes (Signed)
History     CSN: [id number]  Arrival date & time 01/02/12  1148   First MD Initiated Contact with Patient 01/02/12 1201      Chief Complaint  Patient presents with  . Fall  . Nausea    (Consider location/radiation/quality/duration/timing/severity/associated sxs/prior treatment) The history is provided by the patient.   patient reports injuring her head and 3 days ago.  Since then she's had continued nausea and several episodes of vomiting.  No change in her vision.  She also reports some neck pain.  She denies weakness of her upper lower extremities.  She was drinking alcohol and injured her left head when falling and hitting a box on the ground.  She had loss of consciousness.  She was seen in outside hospital was reported that she has normal head CT.  She went to see her primary care doctor today for ongoing symptoms and was sent to the emergency department for the concern of anisocoria.  Family reports no significant confusion.  The patient is not on anticoagulants.  Her symptoms are mild to moderate in severity.  Nothing worsens or improves her symptoms  Past Medical History  Diagnosis Date  . Asthma   . Headache     Past Surgical History  Procedure Date  . Tibia fracture surgery     left  . Left oopherectomy 2005    Family History  Problem Relation Age of Onset  . Diabetes      family hx  . Hypertension      family hx  . Cancer      lung    History  Substance Use Topics  . Smoking status: Former Smoker -- 5 years  . Smokeless tobacco: Never Used  . Alcohol Use: 3.5 oz/week    7 drink(s) per week    OB History    Grav Para Term Preterm Abortions TAB SAB Ect Mult Living                  Review of Systems  All other systems reviewed and are negative.    Allergies  Review of patient's allergies indicates no known allergies.  Home Medications   Current Outpatient Rx  Name  Route  Sig  Dispense  Refill  . ALBUTEROL SULFATE HFA 108 (90 BASE) MCG/ACT  IN AERS   Inhalation   Inhale 2 puffs into the lungs every 4 (four) hours as needed for wheezing.   1 Inhaler   2   . ALPRAZOLAM 1 MG PO TABS   Oral   Take 1 tablet (1 mg total) by mouth 3 (three) times daily as needed for anxiety.   90 tablet   5   . HYDROCODONE-ACETAMINOPHEN 5-500 MG PO TABS   Oral   Take 1 tablet by mouth every 6 (six) hours as needed for pain.   60 tablet   0   . NORETHIN ACE-ETH ESTRAD-FE 1-20 MG-MCG PO TABS   Oral   Take 1 tablet by mouth daily.           Marland Kitchen PROPRANOLOL HCL 20 MG PO TABS   Oral   Take 20 mg by mouth daily.         . TOPIRAMATE 25 MG PO TABS   Oral   Take 100 mg by mouth daily.         Marland Kitchen ONDANSETRON 8 MG PO TBDP   Oral   Take 1 tablet (8 mg total) by mouth every 8 (eight) hours as  needed for nausea.   10 tablet   0   . OXYCODONE-ACETAMINOPHEN 5-325 MG PO TABS   Oral   Take 1 tablet by mouth every 4 (four) hours as needed for pain.   15 tablet   0     BP 95/64  Pulse 62  Temp 98.3 F (36.8 C) (Oral)  Resp 18  SpO2 100%  LMP 12/31/2011  Physical Exam  Nursing note and vitals reviewed. Constitutional: She is oriented to person, place, and time. She appears well-developed and well-nourished. No distress.  HENT:  Head: Normocephalic and atraumatic.  Eyes: EOM are normal. Pupils are equal, round, and reactive to light.  Neck: Normal range of motion.  Cardiovascular: Normal rate, regular rhythm and normal heart sounds.   Pulmonary/Chest: Effort normal and breath sounds normal.  Abdominal: Soft. She exhibits no distension. There is no tenderness.  Musculoskeletal: Normal range of motion.  Neurological: She is alert and oriented to person, place, and time.       5/5 strength in major muscle groups of  bilateral upper and lower extremities. Speech normal. No facial asymetry.   Skin: Skin is warm and dry.  Psychiatric: She has a normal mood and affect. Judgment normal.    ED Course  Procedures (including critical  care time)  Labs Reviewed - No data to display Dg Cervical Spine Complete  01/02/2012  *RADIOLOGY REPORT*  Clinical Data: Pain post trauma  CERVICAL SPINE - COMPLETE 4+ VIEW  Comparison: None.  Findings: Frontal, lateral, bilateral oblique, and open mouth odontoid images were obtained.  There is no fracture or spondylolisthesis.  Prevertebral soft tissues and predental space regions are normal.  Disc spaces appear intact.  There is no appreciable facet arthropathy on the oblique views.  IMPRESSION: No abnormality noted.   Original Report Authenticated By: Bretta Bang, M.D.    Ct Head Wo Contrast  01/02/2012  *RADIOLOGY REPORT*  Clinical Data: Unequal pupils post trauma  CT HEAD WITHOUT CONTRAST  Technique:  Contiguous axial images were obtained from the base of the skull through the vertex without contrast.  Comparison: None.  Findings: The ventricles are normal in size and configuration. There is no mass, hemorrhage, extra-axial fluid collection, or midline shift.  Gray-white compartments are normal.  The bony calvarium appears intact.  The mastoid air cells are clear.  IMPRESSION: Normal study.   Original Report Authenticated By: Bretta Bang, M.D.    I personally reviewed the imaging tests through PACS system I reviewed available ER/hospitalization records through the EMR   1. Concussion   2. Neck pain       MDM  2:05 PM The patient feels better at this time.  Her CT scan is normal.  Discharge home.  The patient has a concussion.        Lyanne Co, MD 01/02/12 [id number]

## 2012-01-02 NOTE — ED Notes (Signed)
Pt states she fell on Sunday, was brought to ED after, sent home, went to doctor's today thinking it was her appt for a regular check up, was told she needs another CT scan and that her L pupil was not dilating. Pt states she has been nauseous but denies vomiting, states she has not been eating.

## 2012-01-03 ENCOUNTER — Ambulatory Visit: Payer: PRIVATE HEALTH INSURANCE | Admitting: Family Medicine

## 2012-01-03 NOTE — Telephone Encounter (Signed)
Pt arrived here on 12-17 thinking her appointment was this am but it was made for tomorrow am--c/o of headache.. Triaged and pt's left eye was drooping and pupils were not equal--having slightly slurred speech.  Discussed with dr fry who suggests she  Goes to ed for another ct.  Pt did not want to go,but mother was called by pt and she did go.

## 2012-01-04 ENCOUNTER — Telehealth: Payer: Self-pay | Admitting: Family Medicine

## 2012-01-04 NOTE — Telephone Encounter (Signed)
Pt would like to get a script for percocet. ED doc wrote script  on 12/17 for 15 tablets. (mon)

## 2012-01-04 NOTE — Telephone Encounter (Signed)
NO more refills. She had a concussion but this should not require long term narcotics. We can call in some Vicodin to use however. Change this to 10/325 to use q 6 hours prn pain, #60 with 2 rf

## 2012-01-05 ENCOUNTER — Other Ambulatory Visit: Payer: Self-pay | Admitting: Family Medicine

## 2012-01-05 MED ORDER — HYDROCODONE-ACETAMINOPHEN 10-325 MG PO TABS: 1.0000 | ORAL_TABLET | Freq: Four times a day (QID) | ORAL | Status: DC | PRN

## 2012-01-05 NOTE — Telephone Encounter (Signed)
Pt notified by telephone.  [facility] [address].

## 2012-01-22 ENCOUNTER — Other Ambulatory Visit (HOSPITAL_COMMUNITY)
Admission: RE | Admit: 2012-01-22 | Discharge: 2012-01-22 | Disposition: A | Discharge status: Home / Self Care | Payer: PRIVATE HEALTH INSURANCE | Source: Ambulatory Visit | Attending: Family Medicine | Admitting: Family Medicine

## 2012-01-22 ENCOUNTER — Ambulatory Visit (INDEPENDENT_AMBULATORY_CARE_PROVIDER_SITE_OTHER): Payer: PRIVATE HEALTH INSURANCE | Admitting: Family Medicine

## 2012-01-22 ENCOUNTER — Encounter: Payer: Self-pay | Admitting: Family Medicine

## 2012-01-22 VITALS — BP 110/70 | HR 112 | Temp 98.5°F | Ht 65.5 in | Wt 137.0 lb

## 2012-01-22 DIAGNOSIS — Z01419 Encounter for gynecological examination (general) (routine) without abnormal findings: Secondary | ICD-10-CM | POA: Insufficient documentation

## 2012-01-22 DIAGNOSIS — Z0000 Encounter for general adult medical examination without abnormal findings: Secondary | ICD-10-CM

## 2012-01-22 MED ORDER — ALPRAZOLAM 1 MG PO TABS: 1.0000 mg | ORAL_TABLET | Freq: Three times a day (TID) | ORAL | Status: DC | PRN

## 2012-01-22 MED ORDER — TOPIRAMATE 100 MG PO TABS: 100.0000 mg | ORAL_TABLET | Freq: Every day | ORAL | Status: DC

## 2012-01-22 MED ORDER — NORETHIN ACE-ETH ESTRAD-FE 1-20 MG-MCG PO TABS: 1.0000 | ORAL_TABLET | Freq: Every day | ORAL | Status: DC

## 2012-01-22 NOTE — Addendum Note (Signed)
Addended by: Aniceto Boss A on: 01/22/2012 11:29 AM   Modules accepted: Orders

## 2012-01-22 NOTE — Progress Notes (Signed)
Subjective:    Patient ID: Ashlee Vasquez, female    DOB: 06-25-1980, 31 y.o.   MRN: [id number]  HPI 32 yr old female for a cpx. She is doing well in general except for some HAs after a concussion she had on 12-30-11. She went to the ER and had a normal exam with a normal head CT. She has had some lingering HAs since then but she had also stopped her Topamax around that time. This had been fairly successful at preventing her migraines.    Review of Systems  Constitutional: Negative.  Negative for fever, diaphoresis, activity change, appetite change, fatigue and unexpected weight change.  HENT: Negative.  Negative for hearing loss, ear pain, nosebleeds, congestion, sore throat, trouble swallowing, neck pain, neck stiffness, voice change and tinnitus.   Eyes: Negative.  Negative for photophobia, pain, discharge, redness and visual disturbance.  Respiratory: Negative.  Negative for apnea, cough, choking, chest tightness, shortness of breath, wheezing and stridor.   Cardiovascular: Negative.  Negative for chest pain, palpitations and leg swelling.  Gastrointestinal: Negative.  Negative for nausea, vomiting, abdominal pain, diarrhea, constipation, blood in stool, abdominal distention and rectal pain.  Genitourinary: Negative.  Negative for dysuria, urgency, frequency, hematuria, flank pain, vaginal bleeding, vaginal discharge, enuresis, difficulty urinating, vaginal pain and menstrual problem.  Musculoskeletal: Negative.  Negative for myalgias, back pain, joint swelling, arthralgias and gait problem.  Skin: Negative.  Negative for color change, pallor, rash and wound.  Neurological: Negative.  Negative for dizziness, tremors, seizures, syncope, speech difficulty, weakness, light-headedness, numbness and headaches.  Hematological: Negative.  Negative for adenopathy. Does not bruise/bleed easily.  Psychiatric/Behavioral: Negative.  Negative for hallucinations, behavioral problems, confusion, sleep  disturbance, dysphoric mood and agitation. The patient is not nervous/anxious.        Objective:   Physical Exam  Constitutional: She appears well-developed and well-nourished. No distress.  HENT:  Head: Normocephalic and atraumatic.  Right Ear: External ear normal.  Left Ear: External ear normal.  Nose: Nose normal.  Mouth/Throat: Oropharynx is clear and moist. No oropharyngeal exudate.  Eyes: Conjunctivae normal and EOM are normal. Pupils are equal, round, and reactive to light. Right eye exhibits no discharge. Left eye exhibits no discharge. No scleral icterus.  Neck: Normal range of motion. Neck supple. No JVD present. No thyromegaly present.  Cardiovascular: Normal rate, regular rhythm, normal heart sounds and intact distal pulses.  Exam reveals no gallop and no friction rub.   No murmur heard. Pulmonary/Chest: Effort normal and breath sounds normal. No stridor. No respiratory distress. She has no wheezes. She has no rales. She exhibits no tenderness.  Abdominal: Soft. Normal appearance and bowel sounds are normal. She exhibits no distension, no abdominal bruit, no ascites and no mass. There is no hepatosplenomegaly. There is no tenderness. There is no rigidity, no rebound and no guarding. No hernia.  Genitourinary: Rectum normal, vagina normal and uterus normal. No breast swelling, tenderness, discharge or bleeding. Cervix exhibits no motion tenderness, no discharge and no friability. Right adnexum displays no mass, no tenderness and no fullness. Left adnexum displays no mass, no tenderness and no fullness. No erythema, tenderness or bleeding around the vagina. No vaginal discharge found.  Musculoskeletal: Normal range of motion. She exhibits no edema and no tenderness.  Lymphadenopathy:    She has no cervical adenopathy.  Neurological: She is alert. She has normal reflexes. No cranial nerve deficit. She exhibits normal muscle tone. Coordination normal.  Skin: Skin is warm and dry. No  rash noted. She is not diaphoretic. No erythema. No pallor.  Psychiatric: She has a normal mood and affect. Her behavior is normal. Judgment and thought content normal.          Assessment & Plan:  Well exam. Get back on Topamax.

## 2012-01-26 NOTE — Progress Notes (Signed)
Quick Note:  I left voice message with results. ______ 

## 2012-02-22 ENCOUNTER — Other Ambulatory Visit: Payer: Self-pay | Admitting: Family Medicine

## 2012-02-22 ENCOUNTER — Telehealth: Payer: Self-pay | Admitting: Family Medicine

## 2012-02-22 MED ORDER — PROMETHAZINE HCL 25 MG PO TABS: 25.0000 mg | ORAL_TABLET | ORAL | Status: DC | PRN

## 2012-02-22 NOTE — Telephone Encounter (Signed)
Call in Phenergan 25 mg q 4 hours prn nausea, #60 with 5 rf 

## 2012-02-22 NOTE — Telephone Encounter (Signed)
Rx sent to pharmacy.  Pt is aware (per [place]).

## 2012-02-22 NOTE — Telephone Encounter (Signed)
Pt said the script for opiramate (TOPAMAX) 100 MG tablet (for her migrains) is $250 and she would like phenergan to be called in if possible. Patient is very nauseous.  [facility] [address]

## 2012-03-07 ENCOUNTER — Telehealth: Payer: Self-pay | Admitting: Family Medicine

## 2012-03-07 NOTE — Telephone Encounter (Signed)
Patient called stating that she need a refill of her hydrocodone. Please assist.

## 2012-03-08 NOTE — Telephone Encounter (Signed)
I spoke with pt  

## 2012-03-08 NOTE — Telephone Encounter (Signed)
NO refills yet. If she has gone through #180 of these in just 8 weeks she is taking way too much of them. Maybe she needs to see a headache specialist?

## 2012-06-14 ENCOUNTER — Other Ambulatory Visit: Payer: Self-pay | Admitting: Family Medicine

## 2012-06-17 ENCOUNTER — Ambulatory Visit: Payer: PRIVATE HEALTH INSURANCE | Admitting: Family Medicine

## 2012-06-18 NOTE — Telephone Encounter (Signed)
Call in #60 with 2 rf 

## 2012-06-19 ENCOUNTER — Ambulatory Visit: Payer: PRIVATE HEALTH INSURANCE | Admitting: Family Medicine

## 2012-06-25 ENCOUNTER — Other Ambulatory Visit: Payer: Self-pay | Admitting: Family Medicine

## 2012-06-26 NOTE — Telephone Encounter (Signed)
Call in #90 with 5 rf 

## 2012-07-15 ENCOUNTER — Ambulatory Visit: Payer: PRIVATE HEALTH INSURANCE | Admitting: Family Medicine

## 2012-07-17 ENCOUNTER — Ambulatory Visit: Payer: PRIVATE HEALTH INSURANCE | Admitting: Family Medicine

## 2012-08-12 ENCOUNTER — Other Ambulatory Visit: Payer: Self-pay | Admitting: Family Medicine

## 2012-08-14 ENCOUNTER — Other Ambulatory Visit: Payer: Self-pay | Admitting: Family Medicine

## 2012-08-15 NOTE — Telephone Encounter (Signed)
Call in #60 with 2 rf 

## 2012-09-27 ENCOUNTER — Other Ambulatory Visit: Payer: Self-pay | Admitting: Family Medicine

## 2012-09-27 NOTE — Telephone Encounter (Signed)
NO this is too early to refill. She is using these too often

## 2012-09-30 ENCOUNTER — Other Ambulatory Visit: Payer: Self-pay | Admitting: Family Medicine

## 2012-11-03 ENCOUNTER — Other Ambulatory Visit: Payer: Self-pay | Admitting: Family Medicine

## 2012-11-04 NOTE — Telephone Encounter (Signed)
NO, she has refills on Junel until January 2015 and refills on Xanax until 12-25-12

## 2012-11-05 NOTE — Telephone Encounter (Signed)
I spoke with pharmacy and pt has used up all the refills on each medication. Please advise?

## 2012-11-06 NOTE — Telephone Encounter (Signed)
Call in 6 months of the Xanax and one year of the BCP

## 2013-04-16 ENCOUNTER — Other Ambulatory Visit: Payer: Self-pay | Admitting: Family Medicine

## 2013-04-17 NOTE — Telephone Encounter (Signed)
Call in #90 with no refills. She needs an OV soon  

## 2013-05-21 ENCOUNTER — Other Ambulatory Visit: Payer: Self-pay | Admitting: Family Medicine

## 2013-05-22 ENCOUNTER — Telehealth: Payer: Self-pay | Admitting: Family Medicine

## 2013-05-22 NOTE — Telephone Encounter (Addendum)
Pt has made needed appt for med refills and would like to know if you could send in enough to get through unitl appt 5/20? ALPRAZolam (XANAX) 1 MG tablet promethazine (PHENERGAN) 25 MG tablet  Pt going out of town and leaving at noon Friday,  [facility]

## 2013-05-23 ENCOUNTER — Other Ambulatory Visit: Payer: Self-pay | Admitting: Family Medicine

## 2013-05-23 MED ORDER — ALPRAZOLAM 1 MG PO TABS: ORAL_TABLET | ORAL | Status: DC

## 2013-05-23 MED ORDER — PROMETHAZINE HCL 25 MG PO TABS: 25.0000 mg | ORAL_TABLET | ORAL | Status: DC | PRN

## 2013-05-23 NOTE — Telephone Encounter (Signed)
I sent script to pharmacy.

## 2013-05-23 NOTE — Telephone Encounter (Signed)
Call in #60 of both

## 2013-06-04 ENCOUNTER — Ambulatory Visit: Payer: Self-pay | Admitting: Family Medicine

## 2013-06-11 ENCOUNTER — Encounter: Payer: Self-pay | Admitting: *Deleted

## 2013-06-11 ENCOUNTER — Encounter: Payer: Self-pay | Admitting: Family Medicine

## 2013-06-11 ENCOUNTER — Ambulatory Visit (INDEPENDENT_AMBULATORY_CARE_PROVIDER_SITE_OTHER): Payer: Self-pay | Admitting: Family Medicine

## 2013-06-11 VITALS — BP 113/95 | HR 104 | Temp 98.2°F | Ht 65.5 in | Wt 186.0 lb

## 2013-06-11 DIAGNOSIS — J209 Acute bronchitis, unspecified: Secondary | ICD-10-CM

## 2013-06-11 DIAGNOSIS — J45909 Unspecified asthma, uncomplicated: Secondary | ICD-10-CM

## 2013-06-11 DIAGNOSIS — F411 Generalized anxiety disorder: Secondary | ICD-10-CM | POA: Insufficient documentation

## 2013-06-11 DIAGNOSIS — R51 Headache: Secondary | ICD-10-CM

## 2013-06-11 MED ORDER — HYDROCODONE-ACETAMINOPHEN 10-325 MG PO TABS: ORAL_TABLET | ORAL | Status: DC

## 2013-06-11 MED ORDER — AZITHROMYCIN 250 MG PO TABS: ORAL_TABLET | ORAL | Status: DC

## 2013-06-11 MED ORDER — ALPRAZOLAM 1 MG PO TABS: ORAL_TABLET | ORAL | Status: DC

## 2013-06-11 NOTE — Progress Notes (Signed)
Pre visit review using our clinic review tool, if applicable. No additional management support is needed unless otherwise documented below in the visit note. 

## 2013-06-11 NOTE — Progress Notes (Signed)
   Subjective:    Patient ID: Ashlee Vasquez, female    DOB: [birth year], 33 y.o.   MRN: [id number]  HPI Here for refills and also for 10 days of chest congestion and a dry cough. No fever.    Review of Systems  Constitutional: Negative.   HENT: Positive for congestion and postnasal drip. Negative for sinus pressure.   Eyes: Negative.   Respiratory: Positive for cough.   Neurological: Positive for headaches.       Objective:   Physical Exam  Constitutional: She is oriented to person, place, and time. She appears well-developed and well-nourished. No distress.  HENT:  Right Ear: External ear normal.  Left Ear: External ear normal.  Nose: Nose normal.  Mouth/Throat: Oropharynx is clear and moist.  Eyes: Conjunctivae are normal.  Pulmonary/Chest: Effort normal and breath sounds normal.  Lymphadenopathy:    She has no cervical adenopathy.  Neurological: She is alert and oriented to person, place, and time.          Assessment & Plan:  Treat the bronchitis with a Zpack. Refilled meds

## 2013-06-25 ENCOUNTER — Encounter: Payer: Self-pay | Admitting: Family Medicine

## 2013-08-06 ENCOUNTER — Other Ambulatory Visit: Payer: Self-pay | Admitting: Family Medicine

## 2013-09-15 ENCOUNTER — Telehealth: Payer: Self-pay | Admitting: Family Medicine

## 2013-09-15 MED ORDER — HYDROCODONE-ACETAMINOPHEN 10-325 MG PO TABS: ORAL_TABLET | ORAL | Status: DC

## 2013-09-15 NOTE — Telephone Encounter (Signed)
Pt is needing new rx HYDROcodone-acetaminophen (NORCO) 10-325 MG per tablet please call when available for pick up. Pt also wanted to make dr. Sarajane Jews aware that she has lost 25 lbs since her last visit in 37months.

## 2013-09-15 NOTE — Telephone Encounter (Signed)
done

## 2013-09-16 NOTE — Telephone Encounter (Signed)
Script is ready for pick up and I spoke with pt.  

## 2013-11-02 ENCOUNTER — Other Ambulatory Visit: Payer: Self-pay | Admitting: Family Medicine

## 2013-12-04 ENCOUNTER — Other Ambulatory Visit: Payer: Self-pay | Admitting: Family Medicine

## 2013-12-04 ENCOUNTER — Telehealth: Payer: Self-pay | Admitting: Family Medicine

## 2013-12-04 NOTE — Telephone Encounter (Signed)
Pt needs new rx hydrocodone °

## 2013-12-05 NOTE — Telephone Encounter (Signed)
NO, too soon. We can do this next week before the holiday

## 2013-12-05 NOTE — Telephone Encounter (Signed)
Call in #90 with 5 rf 

## 2013-12-16 ENCOUNTER — Telehealth: Payer: Self-pay | Admitting: Family Medicine

## 2013-12-16 MED ORDER — HYDROCODONE-ACETAMINOPHEN 10-325 MG PO TABS: ORAL_TABLET | ORAL | Status: DC

## 2013-12-16 NOTE — Telephone Encounter (Signed)
done

## 2013-12-16 NOTE — Telephone Encounter (Signed)
Script is ready for pick up and I spoke with pt.  

## 2013-12-16 NOTE — Telephone Encounter (Signed)
Refill request for Norco. 

## 2014-02-23 ENCOUNTER — Telehealth: Payer: Self-pay | Admitting: Family Medicine

## 2014-02-23 NOTE — Telephone Encounter (Signed)
Pt request refill HYDROcodone-acetaminophen (NORCO) 10-325 MG per tablet Pt states she lost her rx to refill on 2/1 and wants to know if dr fry will write it again.

## 2014-02-24 NOTE — Telephone Encounter (Signed)
Per Dr. Sarajane Jews he cannot rewrite this script.

## 2014-02-24 NOTE — Telephone Encounter (Signed)
I left a voice message with below information. 

## 2014-03-04 ENCOUNTER — Telehealth: Payer: Self-pay | Admitting: Family Medicine

## 2014-03-04 NOTE — Telephone Encounter (Signed)
Patient is requesting re-fill on HYDROcodone-acetaminophen (NORCO) 10-325 MG per tablet.  She states she could not find the paper RX for February and would at least like a new one for March.

## 2014-03-05 NOTE — Telephone Encounter (Signed)
As we said before, I cannot refill these until they are due, regardless of whether she lost the rx or not. She will n ot be due for refills until 03-17-14.

## 2014-03-06 NOTE — Telephone Encounter (Signed)
I left a voice message with below information. 

## 2014-03-16 ENCOUNTER — Other Ambulatory Visit: Payer: Self-pay | Admitting: Family Medicine

## 2014-03-16 MED ORDER — HYDROCODONE-ACETAMINOPHEN 10-325 MG PO TABS: ORAL_TABLET | ORAL | Status: DC

## 2014-03-16 NOTE — Telephone Encounter (Signed)
Done for one month at a time

## 2014-03-16 NOTE — Telephone Encounter (Signed)
Left message to advise pt Rx ready for pick up 

## 2014-03-16 NOTE — Telephone Encounter (Signed)
Pt needs new rx hydrocodone °

## 2014-04-12 IMAGING — CR DG CERVICAL SPINE COMPLETE 4+V
5 series · 5 of 5 positions shown · non-contrast
Comparison: None.

CLINICAL DATA: Pain post trauma

CERVICAL SPINE - COMPLETE 4+ VIEW

[w cervical spine lat]
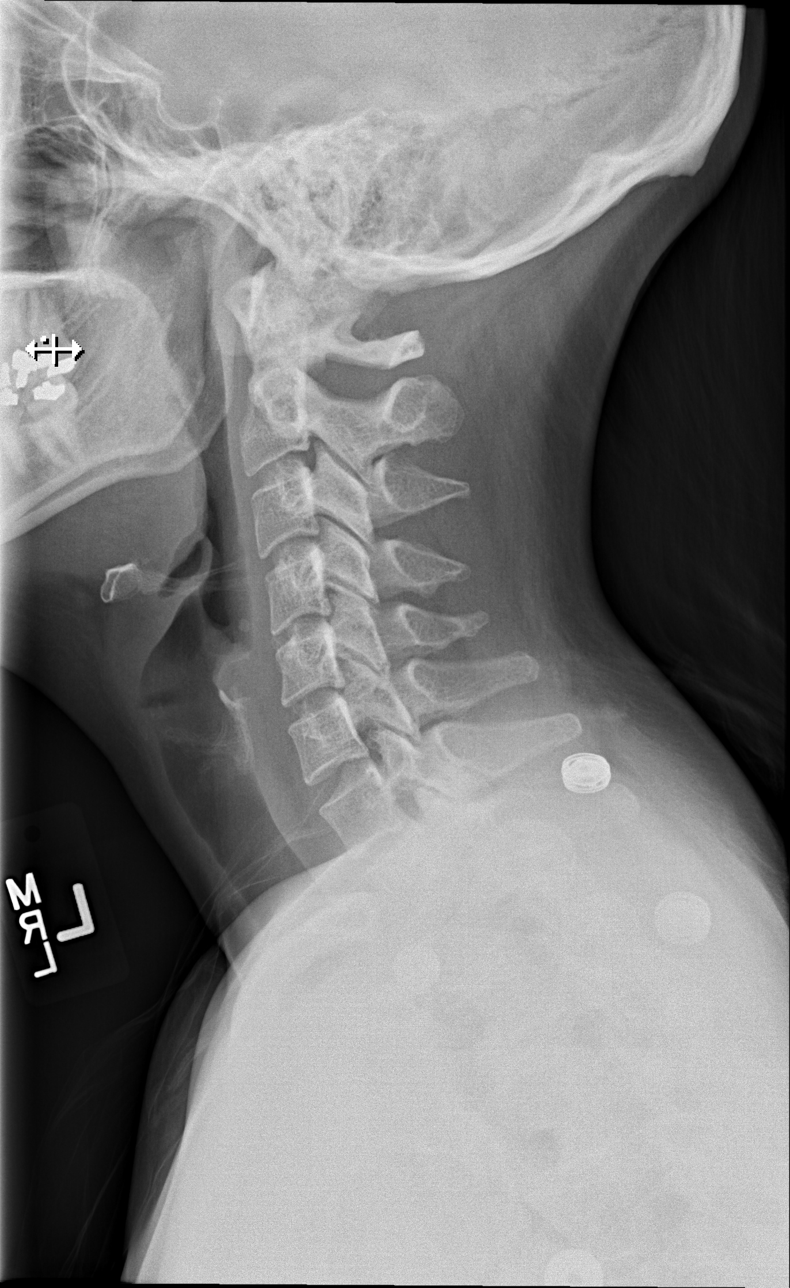

[w cervical spine ap_obl (1 of 2)]
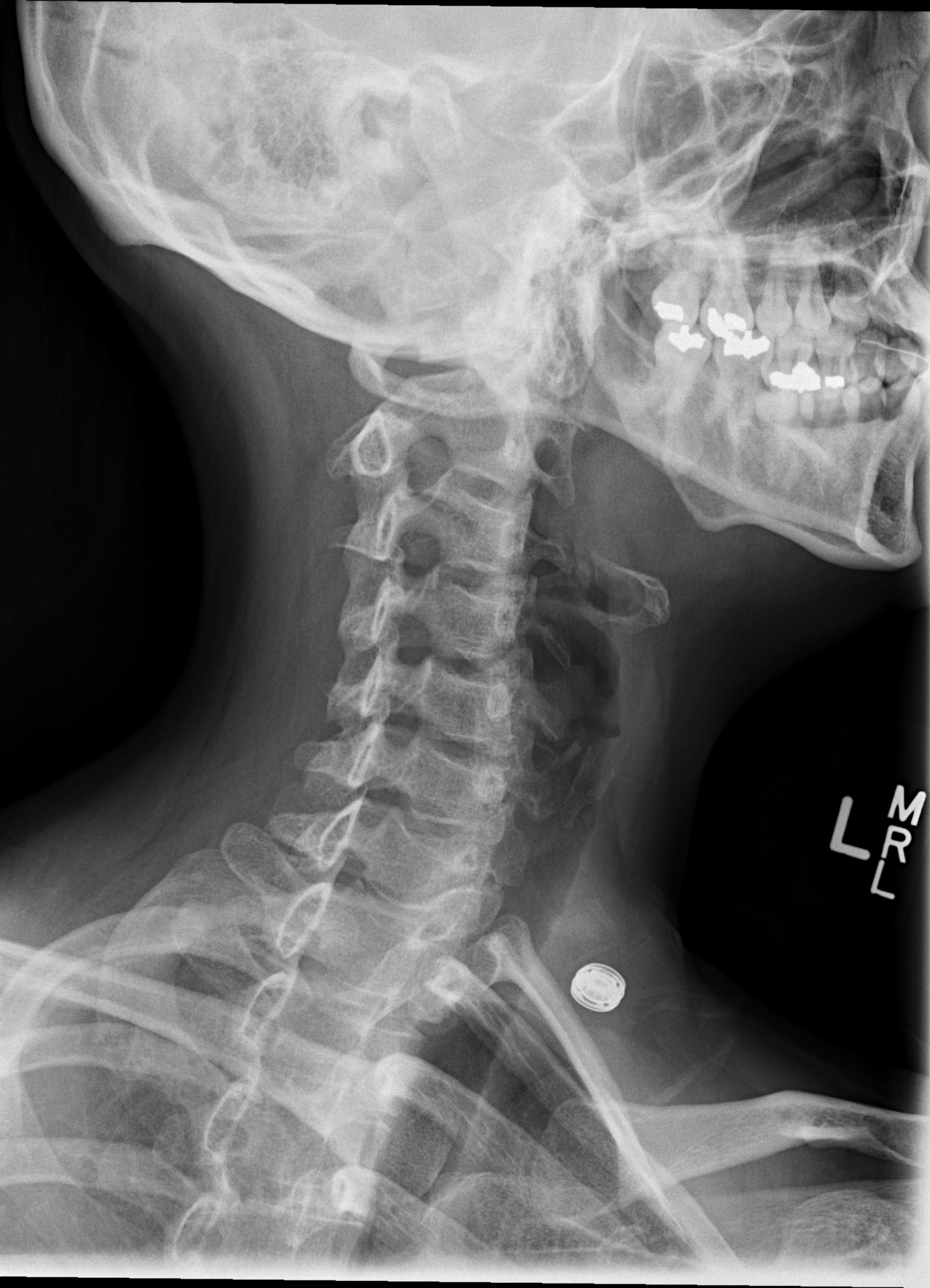

[w cervical spine ap_obl (2 of 2)]
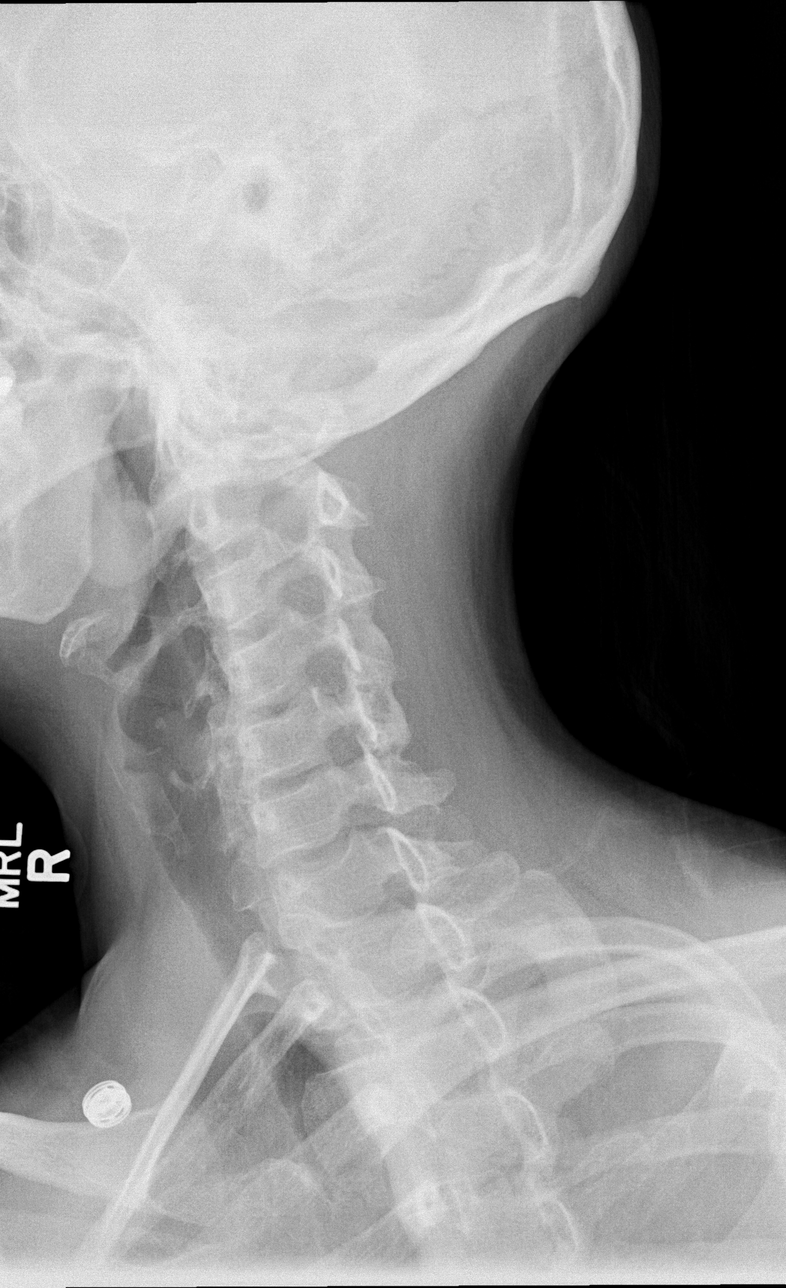

[w cervical spine ap]
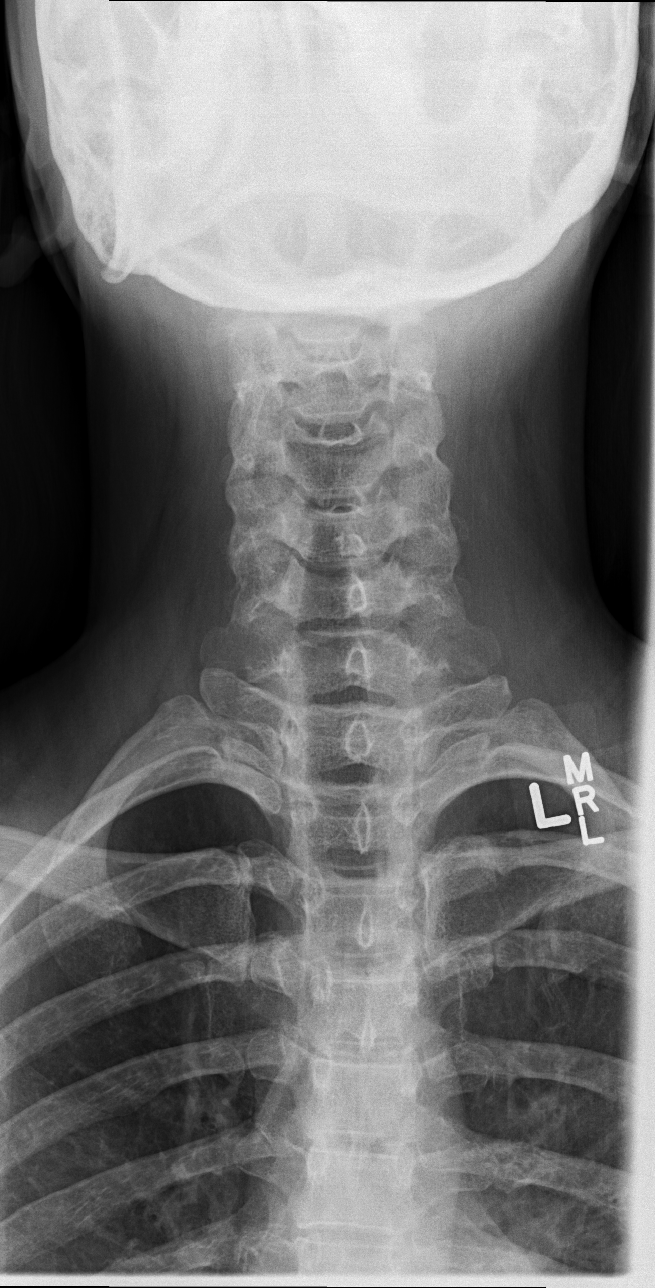

[w cervical spine odontoid]
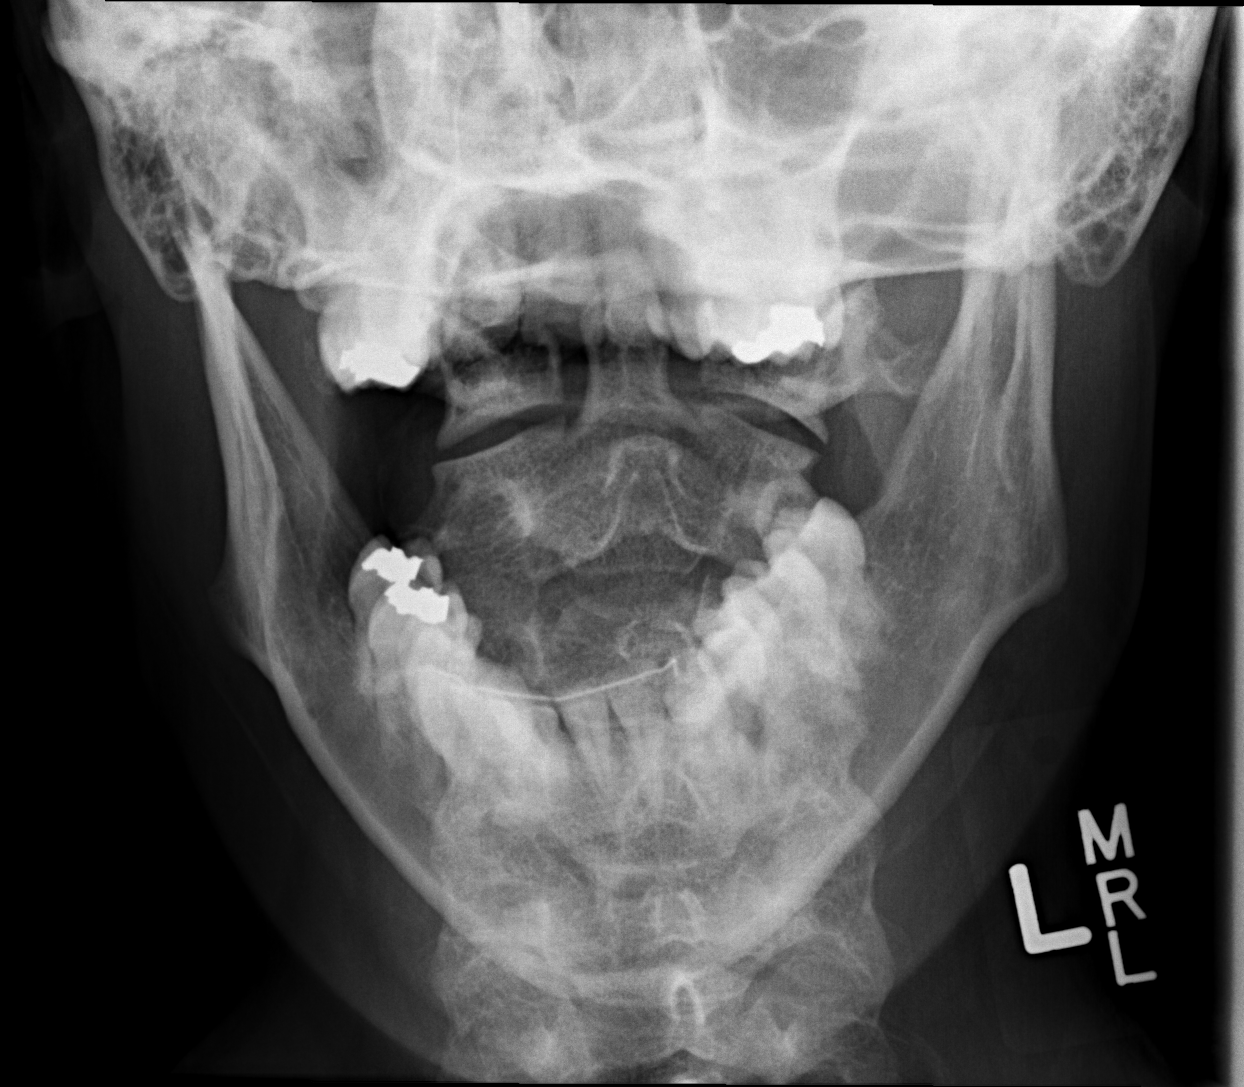

[5 of 5 positions shown; findings below may reference images not displayed]

FINDINGS: Frontal, lateral, bilateral oblique, and open mouth
odontoid images were obtained.  There is no fracture or
spondylolisthesis.  Prevertebral soft tissues and predental space
regions are normal.  Disc spaces appear intact.  There is no
appreciable facet arthropathy on the oblique views.
IMPRESSION: No abnormality noted.

## 2014-04-22 ENCOUNTER — Telehealth: Payer: Self-pay | Admitting: Family Medicine

## 2014-04-22 NOTE — Telephone Encounter (Signed)
Pt needs new rx hydrocodone °

## 2014-04-24 MED ORDER — HYDROCODONE-ACETAMINOPHEN 10-325 MG PO TABS: 1.0000 | ORAL_TABLET | Freq: Two times a day (BID) | ORAL | Status: DC | PRN

## 2014-04-24 NOTE — Telephone Encounter (Signed)
done

## 2014-04-24 NOTE — Telephone Encounter (Signed)
Script is ready for pick up and I spoke with pt.  

## 2014-05-26 ENCOUNTER — Telehealth: Payer: Self-pay | Admitting: Family Medicine

## 2014-05-26 NOTE — Telephone Encounter (Signed)
Please send to Aggie Hacker, RN when complete

## 2014-05-26 NOTE — Telephone Encounter (Signed)
Pt request refill of the following: HYDROcodone-acetaminophen (NORCO) 10-325 MG per tablet ° ° °Phamacy: ° °

## 2014-05-27 MED ORDER — HYDROCODONE-ACETAMINOPHEN 10-325 MG PO TABS: 1.0000 | ORAL_TABLET | Freq: Two times a day (BID) | ORAL | Status: DC | PRN

## 2014-05-27 NOTE — Telephone Encounter (Signed)
Script is ready for pick up and I left a voice message for pt. 

## 2014-05-27 NOTE — Telephone Encounter (Signed)
done

## 2014-05-28 ENCOUNTER — Telehealth: Payer: Self-pay | Admitting: Family Medicine

## 2014-05-28 NOTE — Telephone Encounter (Signed)
We need to clarify directions on hyrocodone. Per Dr. Sarajane Jews it it twice a day. I spoke with pharmacy and gave directions also updated medication list.

## 2014-06-04 ENCOUNTER — Other Ambulatory Visit: Payer: Self-pay | Admitting: Family Medicine

## 2014-06-04 NOTE — Telephone Encounter (Signed)
Call in #90 only. She needs an OV soon  

## 2014-06-07 NOTE — Telephone Encounter (Signed)
Call in 30 day supply but she needs an OV with a Pap smear soon

## 2014-06-08 ENCOUNTER — Other Ambulatory Visit: Payer: Self-pay | Admitting: Family Medicine

## 2014-06-08 NOTE — Telephone Encounter (Signed)
Per Dr. Sarajane Jews okay to refill for 3 month and advise pt to schedule a office visit. I sent script e-scribe and left a voice message with this information for pt.

## 2014-06-08 NOTE — Telephone Encounter (Signed)
Pt requesting a 90 day supply

## 2014-06-24 ENCOUNTER — Telehealth: Payer: Self-pay | Admitting: Family Medicine

## 2014-06-24 MED ORDER — HYDROCODONE-ACETAMINOPHEN 10-325 MG PO TABS: 1.0000 | ORAL_TABLET | Freq: Two times a day (BID) | ORAL | Status: DC | PRN

## 2014-06-24 NOTE — Telephone Encounter (Signed)
done

## 2014-06-24 NOTE — Telephone Encounter (Signed)
Pt needs new rx hydrocodone. Pt has an appt on 07-09-14

## 2014-06-25 NOTE — Telephone Encounter (Signed)
Pt aware Rx ready for pick up 

## 2014-07-06 ENCOUNTER — Other Ambulatory Visit: Payer: Self-pay | Admitting: Family Medicine

## 2014-07-09 ENCOUNTER — Other Ambulatory Visit (HOSPITAL_COMMUNITY)
Admission: RE | Admit: 2014-07-09 | Discharge: 2014-07-09 | Disposition: A | Discharge status: Home / Self Care | Payer: Self-pay | Source: Ambulatory Visit | Attending: Family Medicine | Admitting: Family Medicine

## 2014-07-09 ENCOUNTER — Ambulatory Visit (INDEPENDENT_AMBULATORY_CARE_PROVIDER_SITE_OTHER): Payer: Self-pay | Admitting: Family Medicine

## 2014-07-09 ENCOUNTER — Encounter: Payer: Self-pay | Admitting: Family Medicine

## 2014-07-09 VITALS — BP 100/68 | Temp 98.6°F | Ht 65.5 in | Wt 141.0 lb

## 2014-07-09 DIAGNOSIS — Z01419 Encounter for gynecological examination (general) (routine) without abnormal findings: Secondary | ICD-10-CM | POA: Insufficient documentation

## 2014-07-09 DIAGNOSIS — Z0000 Encounter for general adult medical examination without abnormal findings: Secondary | ICD-10-CM

## 2014-07-09 LAB — CBC WITH DIFFERENTIAL/PLATELET
BASOS ABS: 0 10*3/uL (ref 0.0–0.1)
Basophils Relative: 0.3 % (ref 0.0–3.0)
EOS ABS: 0 10*3/uL (ref 0.0–0.7)
Eosinophils Relative: 0.9 % (ref 0.0–5.0)
HCT: 44 % (ref 36.0–46.0)
Hemoglobin: 15 g/dL (ref 12.0–15.0)
LYMPHS PCT: 52 % — AB (ref 12.0–46.0)
Lymphs Abs: 2.8 10*3/uL (ref 0.7–4.0)
MCHC: 34.1 g/dL (ref 30.0–36.0)
MCV: 94.4 fl (ref 78.0–100.0)
Monocytes Absolute: 0.3 10*3/uL (ref 0.1–1.0)
Monocytes Relative: 6 % (ref 3.0–12.0)
NEUTROS PCT: 40.8 % — AB (ref 43.0–77.0)
Neutro Abs: 2.2 10*3/uL (ref 1.4–7.7)
PLATELETS: 273 10*3/uL (ref 150.0–400.0)
RBC: 4.66 Mil/uL (ref 3.87–5.11)
RDW: 13.4 % (ref 11.5–15.5)
WBC: 5.4 10*3/uL (ref 4.0–10.5)

## 2014-07-09 LAB — BASIC METABOLIC PANEL
BUN: 12 mg/dL (ref 6–23)
CO2: 27 mEq/L (ref 19–32)
CREATININE: 0.77 mg/dL (ref 0.40–1.20)
Calcium: 9.9 mg/dL (ref 8.4–10.5)
Chloride: 102 mEq/L (ref 96–112)
GFR: 92.37 mL/min (ref >60.00)
Glucose, Bld: 82 mg/dL (ref 70–99)
Potassium: 4.7 mEq/L (ref 3.5–5.1)
Sodium: 140 mEq/L (ref 135–145)

## 2014-07-09 LAB — LIPID PANEL
CHOL/HDL RATIO: 7
Cholesterol: 255 mg/dL — ABNORMAL HIGH (ref 0–200)
HDL: 38.4 mg/dL — ABNORMAL LOW (ref >39.00)
NonHDL: 216.6
Triglycerides: 263 mg/dL — ABNORMAL HIGH (ref 0.0–149.0)
VLDL: 52.6 mg/dL — ABNORMAL HIGH (ref 0.0–40.0)

## 2014-07-09 LAB — POCT URINALYSIS DIPSTICK
Blood, UA: NEGATIVE
GLUCOSE UA: NEGATIVE
LEUKOCYTES UA: NEGATIVE
NITRITE UA: NEGATIVE
Protein, UA: NEGATIVE
Spec Grav, UA: 1.02
Urobilinogen, UA: 0.2
pH, UA: 6

## 2014-07-09 LAB — HEPATIC FUNCTION PANEL
ALK PHOS: 47 U/L (ref 39–117)
ALT: 8 U/L (ref 0–35)
AST: 15 U/L (ref 0–37)
Albumin: 4.8 g/dL (ref 3.5–5.2)
BILIRUBIN TOTAL: 0.5 mg/dL (ref 0.2–1.2)
Bilirubin, Direct: 0.1 mg/dL (ref 0.0–0.3)
Total Protein: 7.8 g/dL (ref 6.0–8.3)

## 2014-07-09 LAB — LDL CHOLESTEROL, DIRECT: Direct LDL: 172 mg/dL

## 2014-07-09 LAB — TSH: TSH: 0.39 u[IU]/mL (ref 0.35–4.50)

## 2014-07-09 MED ORDER — HYDROCODONE-ACETAMINOPHEN 10-325 MG PO TABS: 1.0000 | ORAL_TABLET | Freq: Two times a day (BID) | ORAL | Status: DC | PRN

## 2014-07-09 MED ORDER — NORETHIN ACE-ETH ESTRAD-FE 1-20 MG-MCG PO TABS: 1.0000 | ORAL_TABLET | Freq: Every day | ORAL | Status: DC

## 2014-07-09 MED ORDER — ALPRAZOLAM 1 MG PO TABS: 1.0000 mg | ORAL_TABLET | Freq: Three times a day (TID) | ORAL | Status: DC | PRN

## 2014-07-09 MED ORDER — ALBUTEROL SULFATE HFA 108 (90 BASE) MCG/ACT IN AERS
2.0000 | INHALATION_SPRAY | RESPIRATORY_TRACT | Status: AC | PRN
Start: 2014-07-09 — End: 2015-10-20

## 2014-07-09 NOTE — Progress Notes (Signed)
Pre visit review using our clinic review tool, if applicable. No additional management support is needed unless otherwise documented below in the visit note. 

## 2014-07-09 NOTE — Telephone Encounter (Signed)
She was seen today

## 2014-07-09 NOTE — Progress Notes (Signed)
Subjective:    Patient ID: Ashlee Vasquez, female    DOB: Apr 29, 1980, 34 y.o.   MRN: [id number]  HPI 34 yr old female for a cpx. She feels well. Her menses are regular. Her headaches are stable. Her asthma is stable.    Review of Systems  Constitutional: Negative.  Negative for fever, diaphoresis, activity change, appetite change, fatigue and unexpected weight change.  HENT: Negative.  Negative for congestion, ear pain, hearing loss, nosebleeds, sore throat, tinnitus, trouble swallowing and voice change.   Eyes: Negative.  Negative for photophobia, pain, discharge, redness and visual disturbance.  Respiratory: Negative.  Negative for apnea, cough, choking, chest tightness, shortness of breath, wheezing and stridor.   Cardiovascular: Negative.  Negative for chest pain, palpitations and leg swelling.  Gastrointestinal: Negative.  Negative for nausea, vomiting, abdominal pain, diarrhea, constipation, blood in stool, abdominal distention and rectal pain.  Genitourinary: Negative.  Negative for dysuria, urgency, frequency, hematuria, flank pain, vaginal bleeding, vaginal discharge, enuresis, difficulty urinating, vaginal pain and menstrual problem.  Musculoskeletal: Negative.  Negative for myalgias, back pain, joint swelling, arthralgias, gait problem, neck pain and neck stiffness.  Skin: Negative.  Negative for color change, pallor, rash and wound.  Neurological: Negative.  Negative for dizziness, tremors, seizures, syncope, speech difficulty, weakness, light-headedness, numbness and headaches.  Hematological: Negative for adenopathy. Does not bruise/bleed easily.  Psychiatric/Behavioral: Negative.  Negative for hallucinations, behavioral problems, confusion, sleep disturbance, dysphoric mood and agitation. The patient is not nervous/anxious.        Objective:   Physical Exam  Constitutional: She appears well-developed and well-nourished. No distress.  HENT:  Head: Normocephalic and  atraumatic.  Right Ear: External ear normal.  Left Ear: External ear normal.  Nose: Nose normal.  Mouth/Throat: Oropharynx is clear and moist. No oropharyngeal exudate.  Eyes: Conjunctivae and EOM are normal. Pupils are equal, round, and reactive to light. Right eye exhibits no discharge. Left eye exhibits no discharge. No scleral icterus.  Neck: Normal range of motion. Neck supple. No JVD present. No thyromegaly present.  Cardiovascular: Normal rate, regular rhythm, normal heart sounds and intact distal pulses.  Exam reveals no gallop and no friction rub.   No murmur heard. Pulmonary/Chest: Effort normal and breath sounds normal. No stridor. No respiratory distress. She has no wheezes. She has no rales. She exhibits no tenderness.  Abdominal: Soft. Normal appearance and bowel sounds are normal. She exhibits no distension, no abdominal bruit, no ascites and no mass. There is no hepatosplenomegaly. There is no tenderness. There is no rigidity, no rebound and no guarding. No hernia.  Genitourinary: Rectum normal, vagina normal and uterus normal. No breast swelling, tenderness, discharge or bleeding. Cervix exhibits no motion tenderness, no discharge and no friability. Right adnexum displays no mass, no tenderness and no fullness. Left adnexum displays no mass, no tenderness and no fullness. No erythema, tenderness or bleeding in the vagina. No vaginal discharge found.  Musculoskeletal: Normal range of motion. She exhibits no edema or tenderness.  Lymphadenopathy:    She has no cervical adenopathy.  Neurological: She is alert. She has normal reflexes. No cranial nerve deficit. She exhibits normal muscle tone. Coordination normal.  Skin: Skin is warm and dry. No rash noted. She is not diaphoretic. No erythema. No pallor.  Psychiatric: She has a normal mood and affect. Her behavior is normal. Judgment and thought content normal.          Assessment & Plan:  Well exam. Get fasting labs. meds were  refilled.

## 2014-07-10 LAB — CYTOLOGY - PAP

## 2014-07-15 ENCOUNTER — Other Ambulatory Visit: Payer: Self-pay | Admitting: *Deleted

## 2014-07-15 ENCOUNTER — Telehealth: Payer: Self-pay | Admitting: *Deleted

## 2014-07-15 DIAGNOSIS — E7800 Pure hypercholesterolemia, unspecified: Secondary | ICD-10-CM

## 2014-07-15 NOTE — Telephone Encounter (Signed)
Patient called back and stated to [facility] she feels I may have given someone else's results to her.  I talked with the pt and she asked if I had another pt with a her same name and date of birth of [birth year] and I verified her date of birth again and advised her the total cholesterol was 255 and she asked what should it be "before you keel over" and I advised her it should be less than 200 and anything over this amount is high.

## 2014-08-18 ENCOUNTER — Telehealth: Payer: Self-pay | Admitting: Family Medicine

## 2014-08-18 MED ORDER — HYDROCODONE-ACETAMINOPHEN 10-325 MG PO TABS: 1.0000 | ORAL_TABLET | Freq: Two times a day (BID) | ORAL | Status: DC | PRN

## 2014-08-18 NOTE — Telephone Encounter (Signed)
done

## 2014-08-18 NOTE — Telephone Encounter (Signed)
Pt request refill of the following: HYDROcodone-acetaminophen (NORCO) 10-325 MG per tablet ° ° °Phamacy: ° °

## 2014-08-25 ENCOUNTER — Other Ambulatory Visit: Payer: Self-pay | Admitting: Family Medicine

## 2014-08-30 ENCOUNTER — Other Ambulatory Visit: Payer: Self-pay | Admitting: Family Medicine

## 2014-08-31 NOTE — Telephone Encounter (Signed)
Can we refill this? 

## 2014-09-08 ENCOUNTER — Telehealth: Payer: Self-pay | Admitting: Family Medicine

## 2014-09-08 NOTE — Telephone Encounter (Signed)
Pt states her rx ALPRAZolam Duanne Moron) 1 MG tablet [facility]20 Now pt is home and they will not send the remaining refills back to [place].  Pt states she did not know about this rule.  [id number] Telephone  [phone]

## 2014-09-09 ENCOUNTER — Other Ambulatory Visit: Payer: Self-pay | Admitting: Family Medicine

## 2014-09-09 NOTE — Telephone Encounter (Signed)
Pt following up on request.

## 2014-09-10 MED ORDER — ALPRAZOLAM 1 MG PO TABS: 1.0000 mg | ORAL_TABLET | Freq: Three times a day (TID) | ORAL | Status: DC | PRN

## 2014-09-10 NOTE — Telephone Encounter (Signed)
Per Dr. Sarajane Jews okay to call in # 19 with 1 refill. I did call in script, called [facility] out of [facility] to [facility] order and spoke with pt.

## 2014-09-10 NOTE — Telephone Encounter (Signed)
The pt called hoping to get an update on her rx.  Pt's callback - [phone]

## 2014-09-10 NOTE — Telephone Encounter (Signed)
Pt has only enough med for 1 more day

## 2014-10-02 ENCOUNTER — Telehealth: Payer: Self-pay | Admitting: Family Medicine

## 2014-10-02 MED ORDER — ALPRAZOLAM 2 MG PO TABS: 2.0000 mg | ORAL_TABLET | Freq: Three times a day (TID) | ORAL | Status: DC | PRN

## 2014-10-02 NOTE — Telephone Encounter (Signed)
done

## 2014-10-08 ENCOUNTER — Other Ambulatory Visit: Payer: Self-pay

## 2014-10-08 ENCOUNTER — Telehealth: Payer: Self-pay | Admitting: Family Medicine

## 2014-10-08 NOTE — Telephone Encounter (Signed)
Please verify alprazolam should it be 1 mg or 2 mg. Pt is calling pharm for 1mg .

## 2014-10-08 NOTE — Telephone Encounter (Signed)
The xanax dose should be 1 mg. Cancel the order for 2 mg, and call in I mg to take every 8 ours prn anxiety, #90 with 5 rf

## 2014-10-08 NOTE — Telephone Encounter (Signed)
Spoke to patient concerning change of Xanax 2mg . patient stated you was not sure what happened with the dose change. Per Dr.Fry patient can take have of Xanax 1mg  tablet until her new rx of Xanax runs out. I have already called her pharmacy and ask them to deactivated the Xanax 2mg .

## 2014-10-16 ENCOUNTER — Telehealth: Payer: Self-pay | Admitting: Family Medicine

## 2014-10-16 NOTE — Telephone Encounter (Signed)
Pt rcvd call last week advising the Dr had mis prescribed medication... Please call pt back.

## 2014-10-20 NOTE — Telephone Encounter (Signed)
I left a voice message for pt to return my call. We need to update medication list.

## 2014-10-21 NOTE — Telephone Encounter (Signed)
I spoke with pt and she is taking Xanax 2 mg take tid prn. She will notify our office when she needs a refill.

## 2014-11-02 ENCOUNTER — Telehealth: Payer: Self-pay | Admitting: Family Medicine

## 2014-11-02 NOTE — Telephone Encounter (Signed)
Pt needs refill on xanax 2 mg [facility]

## 2014-11-03 NOTE — Telephone Encounter (Signed)
NO she already has refills until [employer]

## 2014-11-04 MED ORDER — ALPRAZOLAM 2 MG PO TABS: 2.0000 mg | ORAL_TABLET | Freq: Three times a day (TID) | ORAL | Status: DC | PRN

## 2014-11-04 NOTE — Telephone Encounter (Signed)
This script did not get called back in September 2016. Per Dr. Sarajane Jews okay to call in #90 with 1 refill and I did [facility].

## 2014-11-17 ENCOUNTER — Telehealth: Payer: Self-pay | Admitting: Family Medicine

## 2014-11-17 NOTE — Telephone Encounter (Signed)
Pt request refill HYDROcodone-acetaminophen (NORCO) 10-325 MG per tablet °

## 2014-11-19 MED ORDER — HYDROCODONE-ACETAMINOPHEN 10-325 MG PO TABS: 1.0000 | ORAL_TABLET | Freq: Two times a day (BID) | ORAL | Status: DC | PRN

## 2014-11-19 NOTE — Telephone Encounter (Signed)
done

## 2014-11-19 NOTE — Telephone Encounter (Signed)
Script is ready for pick up and I spoke with pt.  

## 2014-12-07 ENCOUNTER — Other Ambulatory Visit: Payer: Self-pay | Admitting: Family Medicine

## 2014-12-09 NOTE — Telephone Encounter (Signed)
She takes Xanax 2 mg , NOT the 1 mg. Decline the 1 mg since she does not take it. Decline the 2 mg because this is not due until 01-04-15

## 2014-12-22 ENCOUNTER — Other Ambulatory Visit: Payer: Self-pay

## 2014-12-29 ENCOUNTER — Other Ambulatory Visit: Payer: Self-pay | Admitting: Family Medicine

## 2014-12-29 ENCOUNTER — Other Ambulatory Visit (INDEPENDENT_AMBULATORY_CARE_PROVIDER_SITE_OTHER): Payer: Self-pay

## 2014-12-29 ENCOUNTER — Telehealth: Payer: Self-pay | Admitting: Family Medicine

## 2014-12-29 DIAGNOSIS — E7800 Pure hypercholesterolemia, unspecified: Secondary | ICD-10-CM

## 2014-12-29 LAB — LIPID PANEL
Cholesterol: 199 mg/dL (ref 0–200)
HDL: 55.5 mg/dL (ref >39.00)
LDL Cholesterol: 106 mg/dL — ABNORMAL HIGH (ref 0–99)
NONHDL: 143.03
TRIGLYCERIDES: 184 mg/dL — AB (ref 0.0–149.0)
Total CHOL/HDL Ratio: 4
VLDL: 36.8 mg/dL (ref 0.0–40.0)

## 2014-12-29 NOTE — Telephone Encounter (Signed)
Pt has been sweating and wants to know if it is due to birth control? She wants to know if she needs more estrogen since she only has 1 ovary?

## 2014-12-29 NOTE — Telephone Encounter (Signed)
Call in #90 with 5 rf 

## 2014-12-30 NOTE — Telephone Encounter (Signed)
This could be related to er BCP. Make an OV to talk about this

## 2014-12-30 NOTE — Telephone Encounter (Signed)
I spoke with pt and she will get scheduled.

## 2015-01-04 ENCOUNTER — Ambulatory Visit: Payer: Self-pay | Admitting: Family Medicine

## 2015-01-07 ENCOUNTER — Ambulatory Visit (INDEPENDENT_AMBULATORY_CARE_PROVIDER_SITE_OTHER): Payer: Self-pay | Admitting: Family Medicine

## 2015-01-07 ENCOUNTER — Encounter: Payer: Self-pay | Admitting: Family Medicine

## 2015-01-07 VITALS — BP 101/67 | HR 92 | Temp 98.9°F | Ht 65.5 in | Wt 140.0 lb

## 2015-01-07 DIAGNOSIS — N926 Irregular menstruation, unspecified: Secondary | ICD-10-CM

## 2015-01-07 MED ORDER — NORETHIN-ETH ESTRAD TRIPHASIC 0.5/0.75/1-35 MG-MCG PO TABS: 1.0000 | ORAL_TABLET | Freq: Every day | ORAL | Status: DC

## 2015-01-07 NOTE — Progress Notes (Signed)
   Subjective:    Patient ID: Ashlee Vasquez, female    DOB: 20-Aug-1980, 34 y.o.   MRN: [id number]  HPI Here to discuss her menstrual cycles. For the past 6 months she has had regular monthly cycles with a lot of cramping but with very light bleeding. The cramps start a week before the bleeding, then the bleeding only last 2-3 days. She feels like she is not "emptying" herself out completely. She has been on 3 or 4 different BCP over the years, but I think all these have been monophasic pills.    Review of Systems  Constitutional: Negative.   Respiratory: Negative.   Cardiovascular: Negative.   Endocrine: Negative.        Objective:   Physical Exam  Constitutional: She appears well-developed and well-nourished.  Cardiovascular: Normal rate, regular rhythm, normal heart sounds and intact distal pulses.   Pulmonary/Chest: Effort normal and breath sounds normal.  Abdominal: Soft. Bowel sounds are normal. She exhibits no distension. There is no tenderness. There is no rebound and no guarding.          Assessment & Plan:  I think she may feel better on a triphasic pill, so she will switch to ON 777. Recheck prn

## 2015-01-07 NOTE — Progress Notes (Signed)
Pre visit review using our clinic review tool, if applicable. No additional management support is needed unless otherwise documented below in the visit note. 

## 2015-02-10 ENCOUNTER — Telehealth: Payer: Self-pay | Admitting: Family Medicine

## 2015-02-10 MED ORDER — HYDROCODONE-ACETAMINOPHEN 10-325 MG PO TABS: 1.0000 | ORAL_TABLET | Freq: Two times a day (BID) | ORAL | Status: DC | PRN

## 2015-02-10 NOTE — Telephone Encounter (Signed)
done

## 2015-05-13 ENCOUNTER — Telehealth: Payer: Self-pay | Admitting: Family Medicine

## 2015-05-13 MED ORDER — HYDROCODONE-ACETAMINOPHEN 10-325 MG PO TABS: 1.0000 | ORAL_TABLET | Freq: Two times a day (BID) | ORAL | Status: DC | PRN

## 2015-05-13 NOTE — Telephone Encounter (Signed)
done

## 2015-05-13 NOTE — Telephone Encounter (Signed)
Pt needs new rx hydrocodone °

## 2015-05-14 NOTE — Telephone Encounter (Signed)
Script is ready for pick up and I left a voice message for pt. 

## 2015-05-21 ENCOUNTER — Encounter: Payer: Self-pay | Admitting: Family Medicine

## 2015-05-21 ENCOUNTER — Ambulatory Visit (INDEPENDENT_AMBULATORY_CARE_PROVIDER_SITE_OTHER): Payer: Self-pay | Admitting: Family Medicine

## 2015-05-21 VITALS — BP 98/66 | HR 69 | Temp 99.4°F | Ht 65.5 in | Wt 137.0 lb

## 2015-05-21 DIAGNOSIS — D1723 Benign lipomatous neoplasm of skin and subcutaneous tissue of right leg: Secondary | ICD-10-CM

## 2015-05-21 NOTE — Progress Notes (Signed)
   Subjective:    Patient ID: Ashlee Vasquez, female    DOB: [birth year], 35 y.o.   MRN: [id number]  HPI Here to check an area on the right thigh she noticed a month ago. It is not painful but it worries her.   Review of Systems  Constitutional: Negative.        Objective:   Physical Exam  Constitutional: She appears well-developed and well-nourished.  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 Recheck prn. Laurey Morale, MD

## 2015-05-21 NOTE — Progress Notes (Signed)
Pre visit review using our clinic review tool, if applicable. No additional management support is needed unless otherwise documented below in the visit note. 

## 2015-06-15 ENCOUNTER — Other Ambulatory Visit: Payer: Self-pay | Admitting: Family Medicine

## 2015-06-15 NOTE — Telephone Encounter (Signed)
Call in #90 with 5 rf 

## 2015-06-16 ENCOUNTER — Telehealth: Payer: Self-pay | Admitting: Family Medicine

## 2015-06-16 NOTE — Telephone Encounter (Signed)
Spoke to Dr. Sarajane Jews.  He advised a follow up appt for re evaluation.  During the visit he may order lab work, a referral or possibly imaging.  Her history will be considered along with current sx to construct the best treatment plan.  Tried to reach the pt.  Left a message for a return call.

## 2015-06-16 NOTE — Telephone Encounter (Signed)
Spoke to the pt.  Informed her that Dr. Sarajane Jews would like to see her in the office for re evaluation.  Went on to inform her that treatment will depend on her hx and current sx.  Dr. Sarajane Jews may order lab work, referal or imaging based on the information given my her and what he collects in the office visit from physical exam.  Emphasized that she will be treated for this problem at the visit.  Again, pt stated that she was not going to waste money ($100 due to having no insurance) to come in and nothing be done.  Then stated that she was not in a good mood and no longer wanted to talk.  Informed the pt to call back if she changed her mind about treatment.  Told her to have a good day and the call was disconnected.

## 2015-06-16 NOTE — Telephone Encounter (Signed)
I spoke to the pt to gather more information.  She states she has been feeling dizzy and lightheaded.  Passed out over the weekend while opening the refrigerator door.  States this is not new.  Ongoing for several months.  States she has mentioned it to Dr. Sarajane Jews in the past and was told she may need lab work.  Pt refused to come in.  I informed her that I will speak to Dr. Sarajane Jews and call her back.  She stated "he probably doesn't know who I am and don't call me back"  Again, I informed her that I will call back after speaking to Dr. Sarajane Jews.

## 2015-06-16 NOTE — Telephone Encounter (Signed)
[facility]  Patient Name: Ashlee Vasquez  DOB: 1980/07/31    Initial Comment caller states she gets light headed/dizzy and blacking out spells   Nurse Assessment  Nurse: Wayne Sever, RN, Tillie Rung Date/Time (Eastern Time): 06/16/2015 9:25:55 AM  Confirm and document reason for call. If symptomatic, describe symptoms. You must click the next button to save text entered. ---Caller states she is having episodes of dizziness and lightheadness. She states it has been going on for a few months. She states she blacked out this weekend.  Has the patient traveled out of the country within the last 30 days? ---No  Does the patient have any new or worsening symptoms? ---Yes  Will a triage be completed? ---Yes  Related visit to physician within the last 2 weeks? ---No  Does the PT have any chronic conditions? (i.e. diabetes, asthma, etc.) ---No  Is the patient pregnant or possibly pregnant? (Ask all females between the ages of 47-55) ---No  Is this a behavioral health or substance abuse call? ---No     Guidelines    Guideline Title Affirmed Question Affirmed Notes  Fainting [1] All other patients AND [2] now alert and feels fine (Exception: SIMPLE FAINT due to stress, pain, prolonged standing, or suddenly standing)    Final Disposition User   See Physician within 4 Hours (or PCP triage) Wayne Sever, RN, Tillie Rung    Comments  Caller refused appointment. She states her doctor was not able to find what was wrong, so what's the point. Offered her appointment again so doctor could give her another evaluation since she passed out this time. She denies and hung up on this RN   Referrals  [place] REFUSED   Disagree/Comply: Disagree  Disagree/Comply Reason: Disagree with instructions

## 2015-07-16 ENCOUNTER — Telehealth: Payer: Self-pay | Admitting: Family Medicine

## 2015-07-16 NOTE — Telephone Encounter (Signed)
Pt would like to know if you will refill a new rx Silver sulfadiazine 1% (silvadene)  Pt's mom was prescribed by dr duda  Pt states she is a picker, anxiety, bites her nails  and this helps her wounds heal quickly.  [facility] [address]

## 2015-07-28 NOTE — Telephone Encounter (Signed)
This pt needs to be seen by a provider.

## 2015-08-10 ENCOUNTER — Other Ambulatory Visit: Payer: Self-pay | Admitting: Family Medicine

## 2015-08-10 MED ORDER — HYDROCODONE-ACETAMINOPHEN 10-325 MG PO TABS: 1.0000 | ORAL_TABLET | Freq: Two times a day (BID) | ORAL | 0 refills | Status: DC | PRN

## 2015-08-10 NOTE — Telephone Encounter (Signed)
Pt aware that Rx is ready for pick up & up front.

## 2015-08-10 NOTE — Telephone Encounter (Signed)
done

## 2015-08-10 NOTE — Telephone Encounter (Signed)
Pt needs new hydrocodone °

## 2015-10-20 ENCOUNTER — Inpatient Hospital Stay (HOSPITAL_COMMUNITY)
Admission: EM | Admit: 2015-10-20 | Discharge: 2015-10-22 | DRG: 493 | Disposition: A | Discharge status: Home / Self Care | Payer: Self-pay | Attending: Orthopaedic Surgery | Admitting: Orthopaedic Surgery

## 2015-10-20 ENCOUNTER — Emergency Department (HOSPITAL_COMMUNITY): Payer: Self-pay

## 2015-10-20 ENCOUNTER — Encounter (HOSPITAL_COMMUNITY): Payer: Self-pay

## 2015-10-20 DIAGNOSIS — T148XXA Other injury of unspecified body region, initial encounter: Secondary | ICD-10-CM

## 2015-10-20 DIAGNOSIS — J45909 Unspecified asthma, uncomplicated: Secondary | ICD-10-CM | POA: Diagnosis present

## 2015-10-20 DIAGNOSIS — Z419 Encounter for procedure for purposes other than remedying health state, unspecified: Secondary | ICD-10-CM

## 2015-10-20 DIAGNOSIS — S82832A Other fracture of upper and lower end of left fibula, initial encounter for closed fracture: Secondary | ICD-10-CM

## 2015-10-20 DIAGNOSIS — S8252XA Displaced fracture of medial malleolus of left tibia, initial encounter for closed fracture: Secondary | ICD-10-CM

## 2015-10-20 DIAGNOSIS — Z79899 Other long term (current) drug therapy: Secondary | ICD-10-CM

## 2015-10-20 DIAGNOSIS — S9305XA Dislocation of left ankle joint, initial encounter: Secondary | ICD-10-CM | POA: Diagnosis present

## 2015-10-20 DIAGNOSIS — S82892A Other fracture of left lower leg, initial encounter for closed fracture: Secondary | ICD-10-CM

## 2015-10-20 DIAGNOSIS — S82852A Displaced trimalleolar fracture of left lower leg, initial encounter for closed fracture: Principal | ICD-10-CM | POA: Diagnosis present

## 2015-10-20 DIAGNOSIS — Y9355 Activity, bike riding: Secondary | ICD-10-CM

## 2015-10-20 DIAGNOSIS — Z87891 Personal history of nicotine dependence: Secondary | ICD-10-CM

## 2015-10-20 DIAGNOSIS — D62 Acute posthemorrhagic anemia: Secondary | ICD-10-CM | POA: Diagnosis not present

## 2015-10-20 MED ORDER — METHOCARBAMOL 500 MG PO TABS: 500.0000 mg | ORAL_TABLET | Freq: Four times a day (QID) | ORAL | Status: DC | PRN

## 2015-10-20 MED ORDER — HYDROCODONE-ACETAMINOPHEN 10-325 MG PO TABS: 1.0000 | ORAL_TABLET | Freq: Two times a day (BID) | ORAL | Status: DC | PRN

## 2015-10-20 MED ORDER — PROPOFOL 10 MG/ML IV BOLUS
INTRAVENOUS | Status: DC | PRN
  Administered 2015-10-20: 15 mg via INTRAVENOUS
  Administered 2015-10-20: 30 mg via INTRAVENOUS

## 2015-10-20 MED ORDER — ONDANSETRON HCL 4 MG/2ML IJ SOLN
4.0000 mg | Freq: Once | INTRAMUSCULAR | Status: AC
  Administered 2015-10-20: 4 mg via INTRAVENOUS
  Filled 2015-10-20: qty 2

## 2015-10-20 MED ORDER — ONDANSETRON HCL 4 MG/2ML IJ SOLN
INTRAMUSCULAR | Status: AC
  Filled 2015-10-20: qty 2

## 2015-10-20 MED ORDER — ALPRAZOLAM 0.5 MG PO TABS
2.0000 mg | ORAL_TABLET | Freq: Three times a day (TID) | ORAL | Status: DC
  Administered 2015-10-20 – 2015-10-22 (×4): 2 mg via ORAL
  Filled 2015-10-20 (×3): qty 4
  Filled 2015-10-20: qty 8

## 2015-10-20 MED ORDER — FENTANYL CITRATE (PF) 100 MCG/2ML IJ SOLN
INTRAMUSCULAR | Status: AC | PRN
  Administered 2015-10-20: 50 ug via INTRAVENOUS

## 2015-10-20 MED ORDER — PROMETHAZINE HCL 25 MG PO TABS: 25.0000 mg | ORAL_TABLET | Freq: Four times a day (QID) | ORAL | Status: DC | PRN

## 2015-10-20 MED ORDER — ONDANSETRON HCL 4 MG/2ML IJ SOLN
INTRAMUSCULAR | Status: DC | PRN
  Administered 2015-10-20: 4 mg via INTRAVENOUS

## 2015-10-20 MED ORDER — PROPOFOL 10 MG/ML IV BOLUS
1.0000 mg/kg | Freq: Once | INTRAVENOUS | Status: AC
  Administered 2015-10-20: 30 mg via INTRAVENOUS
  Filled 2015-10-20: qty 20

## 2015-10-20 MED ORDER — FENTANYL CITRATE (PF) 100 MCG/2ML IJ SOLN
50.0000 ug | Freq: Once | INTRAMUSCULAR | Status: AC
  Administered 2015-10-20: 50 ug via INTRAVENOUS
  Filled 2015-10-20: qty 2

## 2015-10-20 MED ORDER — OXYCODONE HCL 5 MG PO TABS
5.0000 mg | ORAL_TABLET | ORAL | Status: DC | PRN
  Administered 2015-10-21 (×2): 10 mg via ORAL
  Filled 2015-10-20 (×2): qty 2

## 2015-10-20 MED ORDER — METHOCARBAMOL 1000 MG/10ML IJ SOLN
500.0000 mg | Freq: Four times a day (QID) | INTRAVENOUS | Status: DC | PRN
  Administered 2015-10-21: 500 mg via INTRAVENOUS
  Filled 2015-10-20 (×4): qty 5

## 2015-10-20 MED ORDER — MORPHINE SULFATE (PF) 2 MG/ML IV SOLN
2.0000 mg | INTRAVENOUS | Status: DC | PRN
  Administered 2015-10-21 – 2015-10-22 (×6): 2 mg via INTRAVENOUS
  Filled 2015-10-20 (×6): qty 1

## 2015-10-20 MED ORDER — ACETAMINOPHEN 650 MG RE SUPP: 650.0000 mg | Freq: Four times a day (QID) | RECTAL | Status: DC | PRN

## 2015-10-20 MED ORDER — POLYETHYLENE GLYCOL 3350 17 G PO PACK: 17.0000 g | PACK | Freq: Every day | ORAL | Status: DC | PRN

## 2015-10-20 MED ORDER — ACETAMINOPHEN 325 MG PO TABS: 650.0000 mg | ORAL_TABLET | Freq: Four times a day (QID) | ORAL | Status: DC | PRN

## 2015-10-20 MED ORDER — ALBUTEROL SULFATE (2.5 MG/3ML) 0.083% IN NEBU: 2.5000 mg | INHALATION_SOLUTION | RESPIRATORY_TRACT | Status: DC | PRN

## 2015-10-20 NOTE — ED Notes (Signed)
Patient transported to X-ray 

## 2015-10-20 NOTE — Sedation Documentation (Signed)
Additional 30mg  propofol
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
## 2015-10-20 NOTE — Sedation Documentation (Signed)
Additional 15mg  Propofol

## 2015-10-20 NOTE — Sedation Documentation (Signed)
Additional 60mcg Fentanyl
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
## 2015-10-21 ENCOUNTER — Inpatient Hospital Stay (HOSPITAL_COMMUNITY): Payer: Self-pay | Admitting: Certified Registered Nurse Anesthetist

## 2015-10-21 ENCOUNTER — Inpatient Hospital Stay (HOSPITAL_COMMUNITY): Payer: Self-pay

## 2015-10-21 ENCOUNTER — Encounter (HOSPITAL_COMMUNITY)
Admission: EM | Disposition: A | Discharge status: Home / Self Care | Payer: Self-pay | Source: Home / Self Care | Attending: Orthopaedic Surgery

## 2015-10-21 ENCOUNTER — Encounter (HOSPITAL_COMMUNITY): Payer: Self-pay | Admitting: Surgery

## 2015-10-21 DIAGNOSIS — S82852A Displaced trimalleolar fracture of left lower leg, initial encounter for closed fracture: Secondary | ICD-10-CM

## 2015-10-21 HISTORY — PX: ORIF ANKLE FRACTURE: SHX5408

## 2015-10-21 LAB — URINALYSIS, ROUTINE W REFLEX MICROSCOPIC
Bilirubin Urine: NEGATIVE
Glucose, UA: NEGATIVE mg/dL
Ketones, ur: NEGATIVE mg/dL
LEUKOCYTES UA: NEGATIVE
Nitrite: NEGATIVE
PROTEIN: NEGATIVE mg/dL
Specific Gravity, Urine: 1.007 (ref 1.005–1.030)
pH: 7 (ref 5.0–8.0)

## 2015-10-21 LAB — RAPID URINE DRUG SCREEN, HOSP PERFORMED
AMPHETAMINES: NOT DETECTED
Barbiturates: NOT DETECTED
Benzodiazepines: POSITIVE — AB
Cocaine: NOT DETECTED
OPIATES: POSITIVE — AB
Tetrahydrocannabinol: POSITIVE — AB

## 2015-10-21 LAB — COMPREHENSIVE METABOLIC PANEL
ALT: 10 U/L — ABNORMAL LOW (ref 14–54)
ANION GAP: 9 (ref 5–15)
AST: 16 U/L (ref 15–41)
Albumin: 3.5 g/dL (ref 3.5–5.0)
Alkaline Phosphatase: 35 U/L — ABNORMAL LOW (ref 38–126)
BILIRUBIN TOTAL: 0.3 mg/dL (ref 0.3–1.2)
BUN: 5 mg/dL — AB (ref 6–20)
CO2: 23 mmol/L (ref 22–32)
Calcium: 8.2 mg/dL — ABNORMAL LOW (ref 8.9–10.3)
Chloride: 107 mmol/L (ref 101–111)
Creatinine, Ser: 0.73 mg/dL (ref 0.44–1.00)
Glucose, Bld: 97 mg/dL (ref 65–99)
POTASSIUM: 3.3 mmol/L — AB (ref 3.5–5.1)
Sodium: 139 mmol/L (ref 135–145)
TOTAL PROTEIN: 5.5 g/dL — AB (ref 6.5–8.1)

## 2015-10-21 LAB — CBC WITH DIFFERENTIAL/PLATELET
Basophils Absolute: 0 10*3/uL (ref 0.0–0.1)
Basophils Relative: 0 %
Eosinophils Absolute: 0 10*3/uL (ref 0.0–0.7)
Eosinophils Relative: 0 %
HEMATOCRIT: 32.4 % — AB (ref 36.0–46.0)
Hemoglobin: 10.9 g/dL — ABNORMAL LOW (ref 12.0–15.0)
LYMPHS PCT: 40 %
Lymphs Abs: 2.7 10*3/uL (ref 0.7–4.0)
MCH: 32.7 pg (ref 26.0–34.0)
MCHC: 33.6 g/dL (ref 30.0–36.0)
MCV: 97.3 fL (ref 78.0–100.0)
MONO ABS: 0.4 10*3/uL (ref 0.1–1.0)
MONOS PCT: 6 %
NEUTROS ABS: 3.6 10*3/uL (ref 1.7–7.7)
Neutrophils Relative %: 54 %
Platelets: 199 10*3/uL (ref 150–400)
RBC: 3.33 MIL/uL — ABNORMAL LOW (ref 3.87–5.11)
RDW: 11.8 % (ref 11.5–15.5)
WBC: 6.7 10*3/uL (ref 4.0–10.5)

## 2015-10-21 LAB — URINE MICROSCOPIC-ADD ON

## 2015-10-21 LAB — SURGICAL PCR SCREEN
MRSA, PCR: NEGATIVE
STAPHYLOCOCCUS AUREUS: NEGATIVE

## 2015-10-21 SURGERY — OPEN REDUCTION INTERNAL FIXATION (ORIF) ANKLE FRACTURE
Anesthesia: Regional | Site: Ankle | Laterality: Left

## 2015-10-21 MED ORDER — ACETAMINOPHEN 325 MG PO TABS: 650.0000 mg | ORAL_TABLET | Freq: Four times a day (QID) | ORAL | Status: DC | PRN

## 2015-10-21 MED ORDER — METOCLOPRAMIDE HCL 5 MG PO TABS: 5.0000 mg | ORAL_TABLET | Freq: Three times a day (TID) | ORAL | Status: DC | PRN

## 2015-10-21 MED ORDER — BUPIVACAINE HCL (PF) 0.25 % IJ SOLN
INTRAMUSCULAR | Status: AC
  Filled 2015-10-21: qty 30

## 2015-10-21 MED ORDER — HYDROMORPHONE HCL 1 MG/ML IJ SOLN: 0.2500 mg | INTRAMUSCULAR | Status: DC | PRN

## 2015-10-21 MED ORDER — PROPOFOL 10 MG/ML IV BOLUS
INTRAVENOUS | Status: DC | PRN
  Administered 2015-10-21: 200 mg via INTRAVENOUS

## 2015-10-21 MED ORDER — POTASSIUM CHLORIDE 10 MEQ/100ML IV SOLN
10.0000 meq | INTRAVENOUS | Status: AC
  Administered 2015-10-21 (×5): 10 meq via INTRAVENOUS
  Filled 2015-10-21 (×4): qty 100

## 2015-10-21 MED ORDER — POLYETHYLENE GLYCOL 3350 17 G PO PACK
17.0000 g | PACK | Freq: Every day | ORAL | Status: DC | PRN
Start: 2015-10-21 — End: 2015-10-22

## 2015-10-21 MED ORDER — PROPOFOL 10 MG/ML IV BOLUS
INTRAVENOUS | Status: AC
  Filled 2015-10-21: qty 20

## 2015-10-21 MED ORDER — CEFAZOLIN SODIUM-DEXTROSE 2-4 GM/100ML-% IV SOLN
2.0000 g | INTRAVENOUS | Status: AC
  Administered 2015-10-21 – 2015-10-22 (×2): 2 g via INTRAVENOUS
  Filled 2015-10-21: qty 100

## 2015-10-21 MED ORDER — POTASSIUM CHLORIDE 10 MEQ/100ML IV SOLN
INTRAVENOUS | Status: AC
  Filled 2015-10-21: qty 100

## 2015-10-21 MED ORDER — SODIUM CHLORIDE 0.9 % IV SOLN
INTRAVENOUS | Status: DC
  Administered 2015-10-21: 1 mL via INTRAVENOUS

## 2015-10-21 MED ORDER — OXYCODONE HCL 5 MG PO TABS
5.0000 mg | ORAL_TABLET | ORAL | Status: DC | PRN
  Administered 2015-10-22 (×3): 15 mg via ORAL
  Filled 2015-10-21 (×3): qty 3

## 2015-10-21 MED ORDER — FENTANYL CITRATE (PF) 100 MCG/2ML IJ SOLN
INTRAMUSCULAR | Status: AC
  Filled 2015-10-21: qty 4

## 2015-10-21 MED ORDER — CEFAZOLIN SODIUM-DEXTROSE 2-4 GM/100ML-% IV SOLN
2.0000 g | Freq: Four times a day (QID) | INTRAVENOUS | Status: AC
  Administered 2015-10-21 – 2015-10-22 (×3): 2 g via INTRAVENOUS
  Filled 2015-10-21 (×4): qty 100

## 2015-10-21 MED ORDER — ASPIRIN EC 325 MG PO TBEC: 325.0000 mg | DELAYED_RELEASE_TABLET | Freq: Two times a day (BID) | ORAL | 0 refills | Status: DC

## 2015-10-21 MED ORDER — METOCLOPRAMIDE HCL 5 MG/ML IJ SOLN: 5.0000 mg | Freq: Three times a day (TID) | INTRAMUSCULAR | Status: DC | PRN

## 2015-10-21 MED ORDER — FENTANYL CITRATE (PF) 100 MCG/2ML IJ SOLN
100.0000 ug | Freq: Once | INTRAMUSCULAR | Status: AC
  Administered 2015-10-21: 100 ug via INTRAVENOUS

## 2015-10-21 MED ORDER — MIDAZOLAM HCL 2 MG/2ML IJ SOLN
INTRAMUSCULAR | Status: AC
  Filled 2015-10-21: qty 2

## 2015-10-21 MED ORDER — FENTANYL CITRATE (PF) 100 MCG/2ML IJ SOLN
INTRAMUSCULAR | Status: DC | PRN
  Administered 2015-10-21 (×2): 25 ug via INTRAVENOUS

## 2015-10-21 MED ORDER — PHENYLEPHRINE HCL 10 MG/ML IJ SOLN
INTRAMUSCULAR | Status: DC | PRN
  Administered 2015-10-21: 15 ug/min via INTRAVENOUS

## 2015-10-21 MED ORDER — OXYCODONE-ACETAMINOPHEN 5-325 MG PO TABS: 1.0000 | ORAL_TABLET | ORAL | 0 refills | Status: DC | PRN

## 2015-10-21 MED ORDER — ONDANSETRON HCL 4 MG/2ML IJ SOLN
INTRAMUSCULAR | Status: DC | PRN
  Administered 2015-10-21: 4 mg via INTRAVENOUS

## 2015-10-21 MED ORDER — FENTANYL CITRATE (PF) 100 MCG/2ML IJ SOLN
INTRAMUSCULAR | Status: AC
  Administered 2015-10-21: 100 ug via INTRAVENOUS
  Filled 2015-10-21: qty 2

## 2015-10-21 MED ORDER — ONDANSETRON HCL 4 MG PO TABS: 4.0000 mg | ORAL_TABLET | Freq: Four times a day (QID) | ORAL | Status: DC | PRN

## 2015-10-21 MED ORDER — ONDANSETRON HCL 4 MG/2ML IJ SOLN: 4.0000 mg | Freq: Four times a day (QID) | INTRAMUSCULAR | Status: DC | PRN

## 2015-10-21 MED ORDER — METHOCARBAMOL 1000 MG/10ML IJ SOLN
500.0000 mg | Freq: Four times a day (QID) | INTRAVENOUS | Status: DC | PRN
  Filled 2015-10-21: qty 5

## 2015-10-21 MED ORDER — 0.9 % SODIUM CHLORIDE (POUR BTL) OPTIME
TOPICAL | Status: DC | PRN
  Administered 2015-10-21 (×5): 1000 mL

## 2015-10-21 MED ORDER — LACTATED RINGERS IV SOLN: INTRAVENOUS | Status: DC

## 2015-10-21 MED ORDER — PROMETHAZINE HCL 25 MG/ML IJ SOLN
INTRAMUSCULAR | Status: AC
  Filled 2015-10-21: qty 1

## 2015-10-21 MED ORDER — POVIDONE-IODINE 10 % EX SWAB: 2.0000 "application " | Freq: Once | CUTANEOUS | Status: DC

## 2015-10-21 MED ORDER — LACTATED RINGERS IV SOLN
INTRAVENOUS | Status: DC
  Administered 2015-10-21 (×3): via INTRAVENOUS

## 2015-10-21 MED ORDER — LIDOCAINE 2% (20 MG/ML) 5 ML SYRINGE
INTRAMUSCULAR | Status: DC | PRN
  Administered 2015-10-21: 40 mg via INTRAVENOUS

## 2015-10-21 MED ORDER — MEPERIDINE HCL 25 MG/ML IJ SOLN: 6.2500 mg | INTRAMUSCULAR | Status: DC | PRN

## 2015-10-21 MED ORDER — ACETAMINOPHEN 650 MG RE SUPP: 650.0000 mg | Freq: Four times a day (QID) | RECTAL | Status: DC | PRN

## 2015-10-21 MED ORDER — MIDAZOLAM HCL 2 MG/2ML IJ SOLN
INTRAMUSCULAR | Status: AC
  Administered 2015-10-21: 2 mg via INTRAVENOUS
  Filled 2015-10-21: qty 2

## 2015-10-21 MED ORDER — METHOCARBAMOL 750 MG PO TABS: 750.0000 mg | ORAL_TABLET | Freq: Two times a day (BID) | ORAL | 0 refills | Status: DC | PRN

## 2015-10-21 MED ORDER — PHENYLEPHRINE 40 MCG/ML (10ML) SYRINGE FOR IV PUSH (FOR BLOOD PRESSURE SUPPORT)
PREFILLED_SYRINGE | INTRAVENOUS | Status: DC | PRN
  Administered 2015-10-21: 80 ug via INTRAVENOUS
  Administered 2015-10-21 (×2): 40 ug via INTRAVENOUS
  Administered 2015-10-21: 80 ug via INTRAVENOUS

## 2015-10-21 MED ORDER — PROMETHAZINE HCL 25 MG/ML IJ SOLN
6.2500 mg | INTRAMUSCULAR | Status: DC | PRN
  Administered 2015-10-21: 6.25 mg via INTRAVENOUS

## 2015-10-21 MED ORDER — SENNOSIDES-DOCUSATE SODIUM 8.6-50 MG PO TABS: 1.0000 | ORAL_TABLET | Freq: Every evening | ORAL | 1 refills | Status: DC | PRN

## 2015-10-21 MED ORDER — DIPHENHYDRAMINE HCL 12.5 MG/5ML PO ELIX: 25.0000 mg | ORAL_SOLUTION | ORAL | Status: DC | PRN

## 2015-10-21 MED ORDER — ASPIRIN EC 325 MG PO TBEC
325.0000 mg | DELAYED_RELEASE_TABLET | Freq: Two times a day (BID) | ORAL | Status: DC
  Administered 2015-10-21 – 2015-10-22 (×2): 325 mg via ORAL
  Filled 2015-10-21 (×2): qty 1

## 2015-10-21 MED ORDER — METHOCARBAMOL 500 MG PO TABS
500.0000 mg | ORAL_TABLET | Freq: Four times a day (QID) | ORAL | Status: DC | PRN
  Administered 2015-10-22: 500 mg via ORAL
  Filled 2015-10-21: qty 1

## 2015-10-21 MED ORDER — BUPIVACAINE-EPINEPHRINE (PF) 0.5% -1:200000 IJ SOLN
INTRAMUSCULAR | Status: DC | PRN
  Administered 2015-10-21: 40 mL via PERINEURAL

## 2015-10-21 MED ORDER — KETOROLAC TROMETHAMINE 30 MG/ML IJ SOLN
30.0000 mg | Freq: Four times a day (QID) | INTRAMUSCULAR | Status: DC | PRN
  Administered 2015-10-22: 30 mg via INTRAVENOUS
  Filled 2015-10-21: qty 1

## 2015-10-21 MED ORDER — MAGNESIUM CITRATE PO SOLN
1.0000 | Freq: Once | ORAL | Status: DC | PRN
Start: 2015-10-21 — End: 2015-10-22

## 2015-10-21 MED ORDER — SORBITOL 70 % SOLN
30.0000 mL | Freq: Every day | Status: DC | PRN
  Filled 2015-10-21: qty 30

## 2015-10-21 MED ORDER — ONDANSETRON HCL 4 MG PO TABS: 4.0000 mg | ORAL_TABLET | Freq: Three times a day (TID) | ORAL | 0 refills | Status: DC | PRN

## 2015-10-21 MED ORDER — MEPERIDINE HCL 25 MG/ML IJ SOLN
INTRAMUSCULAR | Status: AC
  Filled 2015-10-21: qty 1

## 2015-10-21 MED ORDER — MIDAZOLAM HCL 2 MG/2ML IJ SOLN
2.0000 mg | Freq: Once | INTRAMUSCULAR | Status: AC
  Administered 2015-10-21: 2 mg via INTRAVENOUS

## 2015-10-21 SURGICAL SUPPLY — 81 items
BANDAGE ACE 4X5 VEL STRL LF (GAUZE/BANDAGES/DRESSINGS) ×1 IMPLANT
BANDAGE ACE 6X5 VEL STRL LF (GAUZE/BANDAGES/DRESSINGS) ×1 IMPLANT
BANDAGE ESMARK 6X9 LF (GAUZE/BANDAGES/DRESSINGS) IMPLANT
BIT DRILL CANN 2.7 (BIT) ×2
BIT DRILL SRG 2.7XCANN AO CPLG (BIT) IMPLANT
BIT DRL SRG 2.7XCANN AO CPLNG (BIT) ×1
BLADE SURG 15 STRL LF DISP TIS (BLADE) ×1 IMPLANT
BLADE SURG 15 STRL SS (BLADE) ×2
BNDG CMPR 9X6 STRL LF SNTH (GAUZE/BANDAGES/DRESSINGS) ×1
BNDG COHESIVE 3X5 TAN STRL LF (GAUZE/BANDAGES/DRESSINGS) ×1 IMPLANT
BNDG COHESIVE 4X5 TAN STRL (GAUZE/BANDAGES/DRESSINGS) ×1 IMPLANT
BNDG COHESIVE 6X5 TAN STRL LF (GAUZE/BANDAGES/DRESSINGS) IMPLANT
BNDG ESMARK 6X9 LF (GAUZE/BANDAGES/DRESSINGS) ×2
CANISTER SUCT 3000ML PPV (MISCELLANEOUS) ×1 IMPLANT
COVER SURGICAL LIGHT HANDLE (MISCELLANEOUS) ×2 IMPLANT
CUFF TOURNIQUET SINGLE 34IN LL (TOURNIQUET CUFF) ×1 IMPLANT
CUFF TOURNIQUET SINGLE 44IN (TOURNIQUET CUFF) IMPLANT
DRAPE C-ARM 42X72 X-RAY (DRAPES) ×2 IMPLANT
DRAPE C-ARMOR (DRAPES) ×2 IMPLANT
DRAPE IMP U-DRAPE 54X76 (DRAPES) ×2 IMPLANT
DRAPE INCISE IOBAN 66X45 STRL (DRAPES) ×1 IMPLANT
DRAPE U-SHAPE 47X51 STRL (DRAPES) ×2 IMPLANT
DRILL 2.6X122MM WL AO SHAFT (BIT) ×1 IMPLANT
DRSG PAD ABDOMINAL 8X10 ST (GAUZE/BANDAGES/DRESSINGS) ×1 IMPLANT
DURAPREP 26ML APPLICATOR (WOUND CARE) ×3 IMPLANT
ELECT CAUTERY BLADE 6.4 (BLADE) ×2 IMPLANT
ELECT REM PT RETURN 9FT ADLT (ELECTROSURGICAL) ×2
ELECTRODE REM PT RTRN 9FT ADLT (ELECTROSURGICAL) ×1 IMPLANT
FACESHIELD WRAPAROUND (MASK) IMPLANT
FACESHIELD WRAPAROUND OR TEAM (MASK) ×1 IMPLANT
GAUZE SPONGE 4X4 12PLY STRL (GAUZE/BANDAGES/DRESSINGS) ×2 IMPLANT
GAUZE XEROFORM 5X9 LF (GAUZE/BANDAGES/DRESSINGS) ×2 IMPLANT
GLOVE SKINSENSE NS SZ7.5 (GLOVE) ×1
GLOVE SKINSENSE STRL SZ7.5 (GLOVE) ×1 IMPLANT
GLOVE SURG SYN 7.5  E (GLOVE) ×2
GLOVE SURG SYN 7.5 E (GLOVE) ×2 IMPLANT
GLOVE SURG SYN 7.5 PF PI (GLOVE) ×2 IMPLANT
GOWN STRL REIN XL XLG (GOWN DISPOSABLE) ×2 IMPLANT
K-WIRE ORTHOPEDIC 1.4X150L (WIRE) ×4
KIT BASIN OR (CUSTOM PROCEDURE TRAY) ×2 IMPLANT
KIT ROOM TURNOVER OR (KITS) ×2 IMPLANT
KWIRE ORTHOPEDIC 1.4X150L (WIRE) IMPLANT
MANIFOLD NEPTUNE WASTE (CANNULA) ×1 IMPLANT
NDL HYPO 25GX1X1/2 BEV (NEEDLE) IMPLANT
NEEDLE HYPO 25GX1X1/2 BEV (NEEDLE) IMPLANT
NS IRRIG 1000ML POUR BTL (IV SOLUTION) ×2 IMPLANT
PACK ORTHO EXTREMITY (CUSTOM PROCEDURE TRAY) ×2 IMPLANT
PAD ARMBOARD 7.5X6 YLW CONV (MISCELLANEOUS) ×4 IMPLANT
PAD CAST 3X4 CTTN HI CHSV (CAST SUPPLIES) ×2 IMPLANT
PAD CAST 4YDX4 CTTN HI CHSV (CAST SUPPLIES) IMPLANT
PADDING CAST COTTON 3X4 STRL (CAST SUPPLIES) ×2
PADDING CAST COTTON 4X4 STRL (CAST SUPPLIES) ×2
PADDING CAST COTTON 6X4 STRL (CAST SUPPLIES) ×2 IMPLANT
PLATE 8H 137MM (Plate) ×1 IMPLANT
PUTTY DBX 1CC (Putty) ×2 IMPLANT
PUTTY DBX 1CC DEPUY (Putty) IMPLANT
RASP HELIOCORDIAL MED (MISCELLANEOUS) ×1 IMPLANT
SCREW 3.5X10MM (Screw) ×1 IMPLANT
SCREW BONE 14MMX3.5MM (Screw) ×3 IMPLANT
SCREW BONE 18 (Screw) ×2 IMPLANT
SCREW BONE 3.5X16MM (Screw) ×2 IMPLANT
SCREW BONE NON-LCKING 3.5X12MM (Screw) ×2 IMPLANT
SCREW CANN 4.0X34MM (Screw) ×1 IMPLANT
SCREW CANN PT 34X4XST SLFDRL (Screw) IMPLANT
SCREW CANNULATED 4.0X34MM (Screw) ×2 IMPLANT
SCREW LOCK 3.5X14 (Screw) ×1 IMPLANT
SCREW LOCKING 3.5X16MM (Screw) ×2 IMPLANT
SCREW LOCKING 3.5X18MM (Screw) ×1 IMPLANT
SPONGE LAP 18X18 X RAY DECT (DISPOSABLE) IMPLANT
SUCTION FRAZIER HANDLE 10FR (MISCELLANEOUS) ×1
SUCTION TUBE FRAZIER 10FR DISP (MISCELLANEOUS) ×1 IMPLANT
SUT ETHILON 3 0 PS 1 (SUTURE) IMPLANT
SUT VIC AB 0 CT1 27 (SUTURE) ×2
SUT VIC AB 0 CT1 27XBRD ANBCTR (SUTURE) IMPLANT
SUT VIC AB 2-0 CT1 27 (SUTURE) ×4
SUT VIC AB 2-0 CT1 TAPERPNT 27 (SUTURE) IMPLANT
SYR CONTROL 10ML LL (SYRINGE) IMPLANT
TOWEL OR 17X24 6PK STRL BLUE (TOWEL DISPOSABLE) ×2 IMPLANT
TOWEL OR 17X26 10 PK STRL BLUE (TOWEL DISPOSABLE) ×4 IMPLANT
TUBE CONNECTING 12X1/4 (SUCTIONS) ×2 IMPLANT
YANKAUER SUCT BULB TIP NO VENT (SUCTIONS) ×1 IMPLANT
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
## 2015-10-21 NOTE — H&P (Signed)

## 2015-10-21 NOTE — Transfer of Care (Signed)
Immediate Anesthesia Transfer of Care Note  Patient: Ashlee Vasquez  Procedure(s) Performed: Procedure(s): OPEN REDUCTION INTERNAL FIXATION (ORIF) ANKLE FRACTURE REMOVAL OF HARDWARE (Left)  Patient Location: PACU  Anesthesia Type:GA combined with regional for post-op pain  Level of Consciousness: awake, alert , oriented and patient cooperative  Airway & Oxygen Therapy: Patient Spontanous Breathing and Patient connected to nasal cannula oxygen  Post-op Assessment: Report given to RN and Post -op Vital signs reviewed and stable  Post vital signs: Reviewed and stable  Last Vitals:  Vitals:   10/21/15 1435 10/21/15 1748  BP: (!) 120/59   Pulse: (!) 102   Resp: 18   Temp:  36.3 C    Last Pain:  Vitals:   10/21/15 1748  TempSrc:   PainSc: 0-No pain      Patients Stated Pain Goal: 5 ([id number])  Complications: No apparent anesthesia complications

## 2015-10-21 NOTE — Anesthesia Procedure Notes (Signed)
Anesthesia Regional Block:  Popliteal block  Pre-Anesthetic Checklist: ,, timeout performed, Correct Patient, Correct Site, Correct Laterality, Correct Procedure, Correct Position, site marked, Risks and benefits discussed, Surgical consent,  Pre-op evaluation,  Post-op pain management  Laterality: Left  Prep: chloraprep       Needles:  Injection technique: Single-shot  Needle Type: Stimiplex     Needle Length: 10cm 10 cm Needle Gauge: 21 and 21 G    Additional Needles:  Procedures: ultrasound guided (picture in chart) and nerve stimulator  Motor weakness within 5 minutes. Popliteal block  Nerve Stimulator or Paresthesia:  Response: Plantar flexion/toe flexion, 0.5 mA,   Additional Responses:   Narrative:  Injection made incrementally with aspirations every 5 mL.  Performed by: Personally  Anesthesiologist: Nolon Nations  Additional Notes: Nerve located and needle positioned with direct ultrasound guidance. Good perineural spread. Patient tolerated well.

## 2015-10-21 NOTE — Anesthesia Procedure Notes (Signed)
Anesthesia Regional Block:  Adductor canal block  Pre-Anesthetic Checklist: ,, timeout performed, Correct Patient, Correct Site, Correct Laterality, Correct Procedure, Correct Position, site marked, Risks and benefits discussed, Surgical consent,  Pre-op evaluation,  Post-op pain management  Laterality: Left  Prep: chloraprep       Needles:  Injection technique: Single-shot  Needle Type: Stimiplex     Needle Length: 9cm 9 cm Needle Gauge: 21 and 21 G    Additional Needles:  Procedures: ultrasound guided (picture in chart) Adductor canal block Narrative:  Injection made incrementally with aspirations every 5 mL.  Performed by: Personally  Anesthesiologist: Aaima Gaddie  Additional Notes: BP cuff, EKG monitors applied. Sedation begun. Artery and nerve location verified with U/S and anesthetic injected incrementally, slowly, and after negative aspirations under direct u/s guidance. Good fascial /perineural spread. Tolerated well.      

## 2015-10-21 NOTE — Addendum Note (Signed)
Addendum  created 10/21/15 2353 by Nolon Nations, MD   Sign clinical note

## 2015-10-21 NOTE — Discharge Instructions (Signed)
° ° °  1. Keep splint clean and dry °2. Elevate foot above level of the heart °3. Take aspirin to prevent blood clots °4. Take pain meds as needed °5. Strict non weight bearing to operative extremity ° °

## 2015-10-21 NOTE — Progress Notes (Signed)
Patient is stable this morning.  CT ankle ordered.  Plan for revision ORIF this afternoon.  Consent obtained.  Azucena Cecil, MD [place] 7:59 AM

## 2015-10-21 NOTE — Addendum Note (Signed)
Addendum  created 10/21/15 2349 by Nolon Nations, MD   Sign clinical note

## 2015-10-21 NOTE — Anesthesia Procedure Notes (Deleted)
Anesthesia Regional Block:  Adductor canal block  Pre-Anesthetic Checklist: ,, timeout performed, Correct Patient, Correct Site, Correct Laterality, Correct Procedure, Correct Position, site marked, Risks and benefits discussed, Surgical consent,  Pre-op evaluation,  Post-op pain management   Prep: chloraprep       Needles:  Injection technique: Single-shot  Needle Type: Stimiplex     Needle Length: 9cm 9 cm Needle Gauge: 21 and 21 G    Additional Needles:  Procedures: ultrasound guided (picture in chart) Adductor canal block Narrative:  Injection made incrementally with aspirations every 5 mL.  Performed by: Personally  Anesthesiologist: Nolon Nations  Additional Notes: BP cuff, EKG monitors applied. Sedation begun. Artery and nerve location verified with U/S and anesthetic injected incrementally, slowly, and after negative aspirations under direct u/s guidance. Good fascial /perineural spread. Tolerated well.

## 2015-10-21 NOTE — Progress Notes (Signed)
Received patient from ED, patient AOx4, VS stable but tachy PR at 101, O2Sat at 98% on RA, with compression wrap at left leg/ankle and pain at 10/10.  Administered PRN medication Morphine 2mg  Inj per order, elevate LLE, and consent signed. Patient resting on bed comfortably. Patient orient to room, bed controls and call light. Will monitor.

## 2015-10-21 NOTE — Progress Notes (Signed)
Orthopedic Tech Progress Note Patient Details:  Ashlee Vasquez 30-Oct-1980 [id number] Assisted Dr. Erlinda Hong in application of posterior short leg splint and stirrup. Ortho Devices Type of Ortho Device: Ace wrap, Post (short leg) splint, Stirrup splint Ortho Device/Splint Location: Well padded plaster posterior short leg splint and stirrup applied to Lt leg Ortho Device/Splint Interventions: Ordered, Application   [employer] 10/21/2015, 12:25 AM

## 2015-10-21 NOTE — Anesthesia Procedure Notes (Signed)
Procedure Name: LMA Insertion Date/Time: 10/21/2015 3:20 PM Performed by: Everlean Cherry A Pre-anesthesia Checklist: Patient identified, Emergency Drugs available, Suction available and Patient being monitored Patient Re-evaluated:Patient Re-evaluated prior to inductionOxygen Delivery Method: Circle system utilized Preoxygenation: Pre-oxygenation with 100% oxygen Intubation Type: IV induction Ventilation: Mask ventilation without difficulty LMA: LMA inserted LMA Size: 4.0 Number of attempts: 1 Placement Confirmation: positive ETCO2 and breath sounds checked- equal and bilateral Tube secured with: Tape Dental Injury: Teeth and Oropharynx as per pre-operative assessment

## 2015-10-21 NOTE — Anesthesia Postprocedure Evaluation (Signed)
Anesthesia Post Note  Patient: Ashlee Vasquez  Procedure(s) Performed: Procedure(s) (LRB): OPEN REDUCTION INTERNAL FIXATION (ORIF) ANKLE FRACTURE REMOVAL OF HARDWARE (Left)  Patient location during evaluation: PACU Anesthesia Type: General Level of consciousness: awake and alert Pain management: pain level controlled Vital Signs Assessment: post-procedure vital signs reviewed and stable Respiratory status: spontaneous breathing, nonlabored ventilation and respiratory function stable Cardiovascular status: blood pressure returned to baseline and stable Postop Assessment: no signs of nausea or vomiting Anesthetic complications: no    Last Vitals:  Vitals:   10/21/15 1918 10/21/15 1943  BP: 106/68 111/63  Pulse: 100 (!) 111  Resp: 15 19  Temp:  36.8 C    Last Pain:  Vitals:   10/21/15 1943  TempSrc: Oral  PainSc:                  Yenni Carra A
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
## 2015-10-21 NOTE — Anesthesia Preprocedure Evaluation (Signed)
Anesthesia Evaluation  Patient identified by MRN, date of birth, ID band Patient awake    Reviewed: Allergy & Precautions, NPO status , Patient's Chart, lab work & pertinent test results  Airway Mallampati: II  TM Distance: >3 FB Neck ROM: Full    Dental no notable dental hx.    Pulmonary asthma , former smoker,    Pulmonary exam normal breath sounds clear to auscultation       Cardiovascular negative cardio ROS Normal cardiovascular exam Rhythm:Regular Rate:Normal     Neuro/Psych  Headaches, negative neurological ROS  negative psych ROS   GI/Hepatic negative GI ROS, Neg liver ROS,   Endo/Other  negative endocrine ROS  Renal/GU negative Renal ROS     Musculoskeletal negative musculoskeletal ROS (+)   Abdominal   Peds  Hematology negative hematology ROS (+)   Anesthesia Other Findings   Reproductive/Obstetrics negative OB ROS                             Anesthesia Physical Anesthesia Plan  ASA: II  Anesthesia Plan: General and Regional   Post-op Pain Management: GA combined w/ Regional for post-op pain   Induction: Intravenous  Airway Management Planned:   Additional Equipment:   Intra-op Plan:   Post-operative Plan: Extubation in OR  Informed Consent: I have reviewed the patients History and Physical, chart, labs and discussed the procedure including the risks, benefits and alternatives for the proposed anesthesia with the patient or authorized representative who has indicated his/her understanding and acceptance.   Dental advisory given  Plan Discussed with: CRNA  Anesthesia Plan Comments:         Anesthesia Quick Evaluation

## 2015-10-21 NOTE — Anesthesia Preprocedure Evaluation (Addendum)
Anesthesia Evaluation  Patient identified by MRN, date of birth, ID band Patient awake    Reviewed: Allergy & Precautions, NPO status , Patient's Chart, lab work & pertinent test results  Airway Mallampati: II  TM Distance: >3 FB Neck ROM: Full    Dental no notable dental hx.    Pulmonary asthma , former smoker,    Pulmonary exam normal breath sounds clear to auscultation       Cardiovascular negative cardio ROS Normal cardiovascular exam Rhythm:Regular Rate:Normal     Neuro/Psych  Headaches, negative neurological ROS  negative psych ROS   GI/Hepatic negative GI ROS, Neg liver ROS,   Endo/Other  negative endocrine ROS  Renal/GU negative Renal ROS     Musculoskeletal negative musculoskeletal ROS (+)   Abdominal   Peds  Hematology negative hematology ROS (+)   Anesthesia Other Findings   Reproductive/Obstetrics negative OB ROS                             Anesthesia Physical Anesthesia Plan  ASA: II  Anesthesia Plan: Regional   Post-op Pain Management:    Induction:   Airway Management Planned:   Additional Equipment:   Intra-op Plan:   Post-operative Plan:   Informed Consent: I have reviewed the patients History and Physical, chart, labs and discussed the procedure including the risks, benefits and alternatives for the proposed anesthesia with the patient or authorized representative who has indicated his/her understanding and acceptance.   Dental advisory given  Plan Discussed with: CRNA  Anesthesia Plan Comments:         Anesthesia Quick Evaluation

## 2015-10-22 ENCOUNTER — Encounter (HOSPITAL_COMMUNITY): Payer: Self-pay | Admitting: Orthopaedic Surgery
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
## 2015-10-22 NOTE — Progress Notes (Signed)
Orthopedic Tech Progress Note Patient Details:  Ashlee Vasquez April 18, 1980 [id number]  Ortho Devices Type of Ortho Device: Crutches Ortho Device/Splint Location: Well padded plaster posterior short leg splint and stirrup applied to Lt leg Ortho Device/Splint Interventions: Application   Antoin Dargis 10/22/2015, 11:45 AM Viewed order from doctor's order list

## 2015-10-22 NOTE — Progress Notes (Signed)
   Subjective:  Patient reports pain as moderate.  No events.  Objective:   VITALS:   Vitals:   10/21/15 1918 10/21/15 1943 10/22/15 0146 10/22/15 0546  BP: 106/68 111/63 114/62 112/61  Pulse: 100 (!) 111 (!) 101 99  Resp: 15 19 18 18   Temp:  98.2 F (36.8 C) 98.1 F (36.7 C) 98.2 F (36.8 C)  TempSrc:  Oral Oral Oral  SpO2: 100% 100% 99% 99%  Weight:      Height:        Neurologically intact Neurovascular intact Sensation intact distally Intact pulses distally Dorsiflexion/Plantar flexion intact Incision: dressing C/D/I and no drainage No cellulitis present Compartment soft   Lab Results  Component Value Date   WBC 6.7 10/21/2015   HGB 10.9 (L) 10/21/2015   HCT 32.4 (L) 10/21/2015   MCV 97.3 10/21/2015   PLT 199 10/21/2015     Assessment/Plan:  1 Day Post-Op   - Expected postop acute blood loss anemia - will monitor for symptoms - Up with PT/OT - DVT ppx - SCDs, ambulation, aspirin - NWB operative extremity - Pain control - Discharge planning - home today after PT  [place] Ephriam Jenkins 10/22/2015, 6:54 AM [phone]

## 2015-10-22 NOTE — Evaluation (Signed)
Physical Therapy Evaluation/Discharge Patient Details Name: Ashlee Vasquez MRN: [id number] DOB: July 22, 1980 Today's Date: 10/22/2015   History of Present Illness  Pt is a 35 y.o. female now s/p ORIF Lt ankle fx with removal of hardware. PMH: asthma, Lt ankle fx (9 yrs ago).  Clinical Impression  Patient evaluated by Physical Therapy with no further acute PT needs identified. All education has been completed and the patient has no further questions. Pt ambulating modified independent with crutches, consistent with NWB status. See below for any follow-up Physical Therapy or equipment needs. PT is signing off. Thank you for this referral.     Follow Up Recommendations No PT follow up;Supervision - Intermittent    Equipment Recommendations  3in1 (PT);Crutches    Recommendations for Other Services       Precautions / Restrictions Precautions Precautions: None Restrictions Weight Bearing Restrictions: Yes LLE Weight Bearing: Non weight bearing      Mobility  Bed Mobility Overal bed mobility: Independent             General bed mobility comments: supine to sit  Transfers Overall transfer level: Independent Equipment used: None             General transfer comment: Pt able to stand from bed and maintain balance on Rt LE until provided with crutches.   Ambulation/Gait Ambulation/Gait assistance: Modified independent (Device/Increase time) Ambulation Distance (Feet): 175 Feet Assistive device: Crutches Gait Pattern/deviations:  (swing-through pattern) Gait velocity: WFL   General Gait Details: good stability, consistent with NWB.   Stairs Stairs: Yes Stairs assistance: Supervision Stair Management: One rail Right;Forwards;With crutches Number of Stairs: 2 General stair comments: good stability, pt reports feeling confident with performing stairs.   Wheelchair Mobility    Modified Rankin (Stroke Patients Only)       Balance Overall balance assessment:  No apparent balance deficits (not formally assessed)                                           Pertinent Vitals/Pain Pain Assessment: 0-10 Pain Score: 7  Pain Location: Lt ankle Pain Descriptors / Indicators: Aching;Tingling Pain Intervention(s): Limited activity within patient's tolerance;Monitored during session    Home Living Family/patient expects to be discharged to:: Private residence   Available Help at Discharge: Neighbor;Available PRN/intermittently Type of Home: Apartment Home Access: Stairs to enter Entrance Stairs-Rails: Right Entrance Stairs-Number of Steps: 2 Home Layout: One level Home Equipment: None      Prior Function Level of Independence: Independent               Hand Dominance        Extremity/Trunk Assessment   Upper Extremity Assessment: Overall WFL for tasks assessed           Lower Extremity Assessment: Overall WFL for tasks assessed         Communication   Communication: No difficulties  Cognition Arousal/Alertness: Awake/alert Behavior During Therapy: WFL for tasks assessed/performed Overall Cognitive Status: Within Functional Limits for tasks assessed                      General Comments      Exercises     Assessment/Plan    PT Assessment Patent does not need any further PT services  PT Problem List            PT Treatment Interventions  PT Goals (Current goals can be found in the Care Plan section)  Acute Rehab PT Goals Patient Stated Goal: go home PT Goal Formulation: With patient Time For Goal Achievement: 10/22/15 Potential to Achieve Goals: Good    Frequency     Barriers to discharge        Co-evaluation               End of Session Equipment Utilized During Treatment: Gait belt Activity Tolerance: Patient tolerated treatment well Patient left: in chair;with call bell/phone within reach (LLE elevated) Nurse Communication: Mobility status    Functional  Assessment Tool Used: clinical judgment Functional Limitation: Mobility: Walking and moving around Mobility: Walking and Moving Around Current Status [id number]): At least 1 percent but less than 20 percent impaired, limited or restricted Mobility: Walking and Moving Around Goal Status [phone]): At least 1 percent but less than 20 percent impaired, limited or restricted Mobility: Walking and Moving Around Discharge Status [phone]): At least 1 percent but less than 20 percent impaired, limited or restricted    Time: 1003-1033 PT Time Calculation (min) (ACUTE ONLY): 30 min   Charges:   PT Evaluation $PT Eval Low Complexity: 1 Procedure PT Treatments $Gait Training: 8-22 mins   PT G Codes:   PT G-Codes **NOT FOR INPATIENT CLASS** Functional Assessment Tool Used: clinical judgment Functional Limitation: Mobility: Walking and moving around Mobility: Walking and Moving Around Current Status [id number]): At least 1 percent but less than 20 percent impaired, limited or restricted Mobility: Walking and Moving Around Goal Status [phone]): At least 1 percent but less than 20 percent impaired, limited or restricted Mobility: Walking and Moving Around Discharge Status [phone]): At least 1 percent but less than 20 percent impaired, limited or restricted    Vasquez Ashlee, PT, CSCS Pager [phone] Office [phone]  10/22/2015, 10:43 AM

## 2015-10-22 NOTE — Progress Notes (Signed)
OT Cancellation Note  Patient Details Name: Ashlee Vasquez MRN: [id number] DOB: December 21, 1980   Cancelled Treatment:    Reason Eval/Treat Not Completed: OT screened, no needs identified, will sign off  Britt Bottom 10/22/2015, 12:13 PM

## 2015-10-22 NOTE — Care Management Note (Signed)
Case Management Note  Patient Details  Name: Ashlee Vasquez MRN: [id number] Date of Birth: Aug 24, 1980  Subjective/Objective:                    Action/Plan:   Expected Discharge Date:                  Expected Discharge Plan:  Home/Self Care  In-House Referral:     Discharge planning Services  CM Consult, Medication Assistance, [facility], [facility]  Post Acute Care Choice:  Durable Medical Equipment Choice offered to:  Patient  DME Arranged:  3-N-1, Walker rolling DME Agency:  [place]:    [facility] Agency:     Status of Service:  Completed, signed off  If discussed at [place] of Stay Meetings, dates discussed:    Additional Comments:  Marilu Favre, RN 10/22/2015, 11:04 AM

## 2015-11-01 ENCOUNTER — Inpatient Hospital Stay (INDEPENDENT_AMBULATORY_CARE_PROVIDER_SITE_OTHER): Payer: Self-pay | Admitting: Orthopaedic Surgery

## 2015-11-01 DIAGNOSIS — S82852D Displaced trimalleolar fracture of left lower leg, subsequent encounter for closed fracture with routine healing: Secondary | ICD-10-CM

## 2015-11-04 ENCOUNTER — Inpatient Hospital Stay (INDEPENDENT_AMBULATORY_CARE_PROVIDER_SITE_OTHER): Payer: Self-pay | Admitting: Orthopaedic Surgery

## 2015-11-04 ENCOUNTER — Inpatient Hospital Stay (INDEPENDENT_AMBULATORY_CARE_PROVIDER_SITE_OTHER): Payer: No Typology Code available for payment source | Admitting: Orthopaedic Surgery

## 2015-11-09 ENCOUNTER — Telehealth: Payer: Self-pay | Admitting: Family Medicine

## 2015-11-09 MED ORDER — HYDROCODONE-ACETAMINOPHEN 10-325 MG PO TABS: 1.0000 | ORAL_TABLET | Freq: Two times a day (BID) | ORAL | 0 refills | Status: DC | PRN

## 2015-11-09 NOTE — Telephone Encounter (Signed)
Script is ready for pick up here at front office and I spoke with pt.  

## 2015-11-09 NOTE — Telephone Encounter (Signed)
Done for one month only  

## 2015-11-09 NOTE — Telephone Encounter (Signed)
Pt needs new rx hydrocodone °

## 2015-11-22 ENCOUNTER — Encounter (INDEPENDENT_AMBULATORY_CARE_PROVIDER_SITE_OTHER): Payer: Self-pay | Admitting: Orthopaedic Surgery

## 2015-11-24 ENCOUNTER — Telehealth (INDEPENDENT_AMBULATORY_CARE_PROVIDER_SITE_OTHER): Payer: Self-pay

## 2015-11-24 NOTE — Telephone Encounter (Signed)
  PER PATIENT VIA E-MAIL, PLEASE ADVISE I did not miss an appointment on the 10/19, it was rescheduled to an earlier date, that I went to and was told not to come back until 11/13.     While I'm on here, is it normal for the top of my foot to still be numb after a month?

## 2015-11-24 NOTE — Telephone Encounter (Signed)
Yes it is normal

## 2015-11-25 NOTE — Telephone Encounter (Signed)
Called pt to advise on e-mail she sent earlier.

## 2015-11-29 ENCOUNTER — Encounter (INDEPENDENT_AMBULATORY_CARE_PROVIDER_SITE_OTHER): Payer: Self-pay | Admitting: Orthopaedic Surgery

## 2015-11-29 ENCOUNTER — Ambulatory Visit (INDEPENDENT_AMBULATORY_CARE_PROVIDER_SITE_OTHER): Payer: Self-pay | Admitting: Orthopaedic Surgery

## 2015-11-29 ENCOUNTER — Ambulatory Visit (INDEPENDENT_AMBULATORY_CARE_PROVIDER_SITE_OTHER): Payer: No Typology Code available for payment source | Admitting: Orthopaedic Surgery

## 2015-11-29 ENCOUNTER — Ambulatory Visit (INDEPENDENT_AMBULATORY_CARE_PROVIDER_SITE_OTHER): Payer: Self-pay

## 2015-11-29 DIAGNOSIS — S82892D Other fracture of left lower leg, subsequent encounter for closed fracture with routine healing: Secondary | ICD-10-CM

## 2015-11-29 MED ORDER — METHOCARBAMOL 500 MG PO TABS: 500.0000 mg | ORAL_TABLET | Freq: Four times a day (QID) | ORAL | 2 refills | Status: DC | PRN

## 2015-11-29 NOTE — Addendum Note (Signed)
Addended by: Azucena Cecil on: 11/29/2015 02:18 PM   Modules accepted: Orders

## 2015-11-29 NOTE — Progress Notes (Signed)
   Office Visit Note   Patient: CAROLINDA LU           Date of Birth: Sep 07, 1980           MRN: [id number] Visit Date: 11/29/2015              Requested by: Laurey Morale, MD [place], [place] [id number] PCP: Laurey Morale, MD   Assessment & Plan: Visit Diagnoses:  1. Closed fracture of left ankle with routine healing, subsequent encounter     Plan:  - advance to [facility] LLE - PT referral given - f/u 6 weeks, repeat ankle xrays  Follow-Up Instructions: Return in about 6 weeks (around 01/10/2016) for recheck left ankle fracture.   Orders:  Orders Placed This Encounter  Procedures  . XR Ankle Complete Left  . Ambulatory referral to Physical Therapy   Meds ordered this encounter  Medications  . methocarbamol (ROBAXIN) 500 MG tablet    Sig: Take 1 tablet (500 mg total) by mouth every 6 (six) hours as needed for muscle spasms.    Dispense:  30 tablet    Refill:  2      Procedures: No procedures performed   Clinical Data: No additional findings.   Subjective: Chief Complaint  Patient presents with  . Left Ankle - Pain, Routine Post Op, Follow-up    6 week postop visit.  Doing well overall.  C/o numbness on dorsum of foot.  Pain is improved.  Not really taking pain meds.    Review of Systems   Objective: Vital Signs: There were no vitals taken for this visit.  Physical Exam  Left Ankle Exam   Comments:  Well healed scars.  Mild swelling.  No signs of infection.  Decreased sensation on dorsum of foot.      Specialty Comments:  No specialty comments available.  Imaging: Xr Ankle Complete Left  Result Date: 11/29/2015 Stable fixation of ankle.  Persistent lucencey of medial mall fx.    PMFS History: Patient Active Problem List   Diagnosis Date Noted  . Closed left ankle fracture 10/20/2015  . Anxiety state, unspecified 06/11/2013  . ASTHMA 09/17/2009  . HEADACHE 09/17/2009   Past Medical History:  Diagnosis Date  .  Asthma   . Headache(784.0)     Family History  Problem Relation Age of Onset  . Diabetes      family hx  . Hypertension      family hx  . Cancer      lung    Past Surgical History:  Procedure Laterality Date  . left oopherectomy  2005  . ORIF ANKLE FRACTURE Left 10/21/2015   Procedure: OPEN REDUCTION INTERNAL FIXATION (ORIF) ANKLE FRACTURE REMOVAL OF HARDWARE;  Surgeon: Leandrew Koyanagi, MD;  Location: [facility];  Service: Orthopedics;  Laterality: Left;  . TIBIA FRACTURE SURGERY     left   Social History   Occupational History  . Not on file.   Social History Main Topics  . Smoking status: Former Smoker    Years: 5.00  . Smokeless tobacco: Never Used  . Alcohol use 0.0 oz/week  . Drug use:     Types: Marijuana  . Sexual activity: Yes

## 2015-12-02 ENCOUNTER — Ambulatory Visit: Payer: Self-pay | Admitting: Physical Therapy

## 2015-12-06 ENCOUNTER — Ambulatory Visit: Payer: No Typology Code available for payment source | Admitting: Physical Therapy

## 2015-12-07 ENCOUNTER — Telehealth (INDEPENDENT_AMBULATORY_CARE_PROVIDER_SITE_OTHER): Payer: Self-pay | Admitting: Orthopaedic Surgery

## 2015-12-07 ENCOUNTER — Other Ambulatory Visit: Payer: Self-pay | Admitting: Family Medicine

## 2015-12-07 ENCOUNTER — Other Ambulatory Visit (HOSPITAL_COMMUNITY): Payer: Self-pay | Admitting: Orthopaedic Surgery

## 2015-12-07 NOTE — Telephone Encounter (Signed)
Ashlee Vasquez called saying the Hydrocodone isn't working as far as the pain she's experiencing. She's wondering if she can receive Oxycodone instead. Please give her a phone call regarding this.  Pt's ph# [phone] Thank you.

## 2015-12-07 NOTE — Telephone Encounter (Signed)
° ° °  Pt request refill of the following:    alprazolam (XANAX) 2 MG tablet  Pt said she know that the below med is not due till next week   HYDROcodone-acetaminophen (NORCO) 10-325 MG tablet   Phamacy:  [facility]

## 2015-12-08 NOTE — Telephone Encounter (Signed)
Please advise 

## 2015-12-08 NOTE — Telephone Encounter (Signed)
She is too far out from surgery to get oxycodone.  Hydrocodone is strongest.  Tramadol is option.  Needs to take ibuprofen and tylenol.

## 2015-12-14 ENCOUNTER — Telehealth (INDEPENDENT_AMBULATORY_CARE_PROVIDER_SITE_OTHER): Payer: Self-pay | Admitting: Orthopaedic Surgery

## 2015-12-14 MED ORDER — HYDROCODONE-ACETAMINOPHEN 10-325 MG PO TABS: 1.0000 | ORAL_TABLET | Freq: Two times a day (BID) | ORAL | 0 refills | Status: DC | PRN

## 2015-12-14 NOTE — Telephone Encounter (Signed)
Norco was printed. Call in Xanax #90 with 5 rf

## 2015-12-14 NOTE — Telephone Encounter (Signed)
Patient requesting Oxycodone 5-325 for L ankle pain.  Patient states he next appt with you is 01/07/16. She states she is fine during the day, but it throbs at night.   Patient can pick up script when available.

## 2015-12-14 NOTE — Telephone Encounter (Signed)
Xanax called in to [facility]. Pt aware of that and rx to pick up. Nothing further needed.

## 2015-12-15 NOTE — Telephone Encounter (Signed)
See other msg

## 2015-12-15 NOTE — Telephone Encounter (Signed)
Called pt no answer LMOM on what Dr Erlinda Hong said

## 2015-12-27 ENCOUNTER — Ambulatory Visit: Payer: Self-pay | Attending: Orthopaedic Surgery | Admitting: Physical Therapy

## 2016-01-07 ENCOUNTER — Ambulatory Visit (INDEPENDENT_AMBULATORY_CARE_PROVIDER_SITE_OTHER): Payer: Self-pay | Admitting: Orthopaedic Surgery

## 2016-01-07 ENCOUNTER — Encounter (INDEPENDENT_AMBULATORY_CARE_PROVIDER_SITE_OTHER): Payer: Self-pay | Admitting: Orthopaedic Surgery

## 2016-01-07 DIAGNOSIS — S82892D Other fracture of left lower leg, subsequent encounter for closed fracture with routine healing: Secondary | ICD-10-CM

## 2016-01-07 MED ORDER — METHOCARBAMOL 500 MG PO TABS: 500.0000 mg | ORAL_TABLET | Freq: Four times a day (QID) | ORAL | 11 refills | Status: DC | PRN

## 2016-01-08 NOTE — Progress Notes (Signed)
Ashlee Vasquez is approximately 3 months status post ORIF ankle fracture. She follows up today for her normal postop check. She is overall doing better. She still reports easy fatigability. She is taking Robaxin for the muscle spasms. She endorses dark discoloration of her left foot. Physical exam shows slight tenderness of the scar. I do not appreciate any signs of infection. I do not see any swelling. There is some some slight dark discoloration of her foot. The foot is warm well-perfused neurovascularly intact benign exam x-rays are stable and show good healing of the fractures. She did not do any physical therapy for financial reasons. I will refill her Robaxin. She can wean herself out of the cam boot at this point. At this point she has reached MMI. Follow up with me as needed.

## 2016-01-18 ENCOUNTER — Telehealth: Payer: Self-pay | Admitting: Family Medicine

## 2016-01-18 MED ORDER — HYDROCODONE-ACETAMINOPHEN 10-325 MG PO TABS: 1.0000 | ORAL_TABLET | Freq: Two times a day (BID) | ORAL | 0 refills | Status: DC | PRN

## 2016-01-18 NOTE — Telephone Encounter (Signed)
Pt needs  New rx hydrocodone °

## 2016-01-18 NOTE — Telephone Encounter (Signed)
done

## 2016-01-19 NOTE — Telephone Encounter (Signed)
Script is ready for pick up here at front office and I spoke with pt.  

## 2016-02-11 ENCOUNTER — Ambulatory Visit (INDEPENDENT_AMBULATORY_CARE_PROVIDER_SITE_OTHER): Payer: Self-pay | Admitting: Orthopaedic Surgery

## 2016-02-11 ENCOUNTER — Encounter (INDEPENDENT_AMBULATORY_CARE_PROVIDER_SITE_OTHER): Payer: Self-pay | Admitting: Orthopaedic Surgery

## 2016-02-11 ENCOUNTER — Telehealth: Payer: Self-pay | Admitting: Family Medicine

## 2016-02-11 ENCOUNTER — Ambulatory Visit (INDEPENDENT_AMBULATORY_CARE_PROVIDER_SITE_OTHER): Payer: Self-pay

## 2016-02-11 DIAGNOSIS — S82892D Other fracture of left lower leg, subsequent encounter for closed fracture with routine healing: Secondary | ICD-10-CM

## 2016-02-11 MED ORDER — HYDROCODONE-ACETAMINOPHEN 10-325 MG PO TABS: 1.0000 | ORAL_TABLET | Freq: Two times a day (BID) | ORAL | 0 refills | Status: DC | PRN

## 2016-02-11 NOTE — Telephone Encounter (Signed)
Script is ready for pick up here at front office and I left a voice message with this information.  

## 2016-02-11 NOTE — Telephone Encounter (Signed)
Pt request refill  °HYDROcodone-acetaminophen (NORCO) 10-325 MG tablet °

## 2016-02-11 NOTE — Telephone Encounter (Signed)
done

## 2016-02-11 NOTE — Progress Notes (Signed)
   Office Visit Note   Patient: Ashlee Vasquez           Date of Birth: May 20, 1980           MRN: [id number] Visit Date: 02/11/2016              Requested by: Laurey Morale, MD [place], [place] [address] PCP: Alysia Penna, MD   Assessment & Plan: Visit Diagnoses:  1. Closed fracture of left ankle with routine healing, subsequent encounter     Plan: I reviewed the x-rays with her and I'm not convinced that she is actually feeling the screw head. I think this could be bony overgrowth. I encouraged her to give it more time to allow more scar tissue to form hopefully this will decrease his irritation. If she continues to have issues at a year we could consider removal of the screws. Questions encouraged and answered. I'll see her back as needed.  Follow-Up Instructions: Return if symptoms worsen or fail to improve.   Orders:  Orders Placed This Encounter  Procedures  . XR Ankle Complete Left   No orders of the defined types were placed in this encounter.     Procedures: No procedures performed   Clinical Data: No additional findings.   Subjective: Chief Complaint  Patient presents with  . Left Ankle - Pain    Sarajean is status post operative fixation of left ankle on 10/21/2015. She comes in today due to concern of a prominent medial malleolus screw. She feels that the screw is backing out.    Review of Systems   Objective: Vital Signs: There were no vitals taken for this visit.  Physical Exam  Ortho Exam Exam of the left ankle shows no obvious signs of prominent hardware. The surgical scars are well-healed. She states she has an area over the anterior medial malleolus that feels like there is a screw head. Specialty Comments:  No specialty comments available.  Imaging: Xr Ankle Complete Left  Result Date: 02/11/2016 No evidence of hardware complication    PMFS History: Patient Active Problem List   Diagnosis Date Noted  . Closed  fracture of left ankle 10/20/2015  . Anxiety state, unspecified 06/11/2013  . ASTHMA 09/17/2009  . HEADACHE 09/17/2009   Past Medical History:  Diagnosis Date  . Asthma   . Headache(784.0)     Family History  Problem Relation Age of Onset  . Diabetes      family hx  . Hypertension      family hx  . Cancer      lung    Past Surgical History:  Procedure Laterality Date  . left oopherectomy  2005  . ORIF ANKLE FRACTURE Left 10/21/2015   Procedure: OPEN REDUCTION INTERNAL FIXATION (ORIF) ANKLE FRACTURE REMOVAL OF HARDWARE;  Surgeon: Leandrew Koyanagi, MD;  Location: [facility];  Service: Orthopedics;  Laterality: Left;  . TIBIA FRACTURE SURGERY     left   Social History   Occupational History  . Not on file.   Social History Main Topics  . Smoking status: Former Smoker    Years: 5.00  . Smokeless tobacco: Never Used  . Alcohol use 0.0 oz/week  . Drug use: Yes    Types: Marijuana  . Sexual activity: Yes

## 2016-03-16 ENCOUNTER — Telehealth: Payer: Self-pay | Admitting: Family Medicine

## 2016-03-16 NOTE — Telephone Encounter (Signed)
Pt needs new rx hydrocodone °

## 2016-03-17 MED ORDER — HYDROCODONE-ACETAMINOPHEN 10-325 MG PO TABS: 1.0000 | ORAL_TABLET | Freq: Two times a day (BID) | ORAL | 0 refills | Status: DC | PRN

## 2016-03-17 NOTE — Telephone Encounter (Signed)
Done for one month only  

## 2016-03-17 NOTE — Telephone Encounter (Signed)
Script is ready for pick up here at front office, tried to reach pt and no answer.  

## 2016-04-13 ENCOUNTER — Telehealth: Payer: Self-pay | Admitting: Family Medicine

## 2016-04-13 MED ORDER — HYDROCODONE-ACETAMINOPHEN 10-325 MG PO TABS: 1.0000 | ORAL_TABLET | Freq: Two times a day (BID) | ORAL | 0 refills | Status: DC | PRN

## 2016-04-13 NOTE — Telephone Encounter (Signed)
Script is ready for pick up here at front office and left a voice message for pt.

## 2016-04-13 NOTE — Telephone Encounter (Signed)
done

## 2016-04-13 NOTE — Telephone Encounter (Signed)
Pt needs new rx hydrocodone °

## 2016-05-10 ENCOUNTER — Telehealth: Payer: Self-pay | Admitting: Family Medicine

## 2016-05-10 NOTE — Telephone Encounter (Signed)
I left a voice message for pt with below information.  

## 2016-05-10 NOTE — Telephone Encounter (Signed)
Pt needs new rx hydrocodone °

## 2016-05-10 NOTE — Telephone Encounter (Signed)
NO, it is not due until 05-17-16

## 2016-05-15 ENCOUNTER — Telehealth: Payer: Self-pay | Admitting: Family Medicine

## 2016-05-15 NOTE — Telephone Encounter (Signed)
° ° °  Pt request refill of the following: ° ° °HYDROcodone-acetaminophen (NORCO) 10-325 MG tablet ° ° °Phamacy: °

## 2016-05-16 MED ORDER — HYDROCODONE-ACETAMINOPHEN 10-325 MG PO TABS: 1.0000 | ORAL_TABLET | Freq: Two times a day (BID) | ORAL | 0 refills | Status: DC | PRN

## 2016-05-16 NOTE — Telephone Encounter (Signed)
Script is ready for pick up here at front office and I spoke with pt, she will schedule the visit soon.

## 2016-05-16 NOTE — Telephone Encounter (Signed)
Done for one month. She will need an OV for any further refills

## 2016-05-31 ENCOUNTER — Ambulatory Visit (INDEPENDENT_AMBULATORY_CARE_PROVIDER_SITE_OTHER): Payer: Self-pay | Admitting: Family Medicine

## 2016-05-31 ENCOUNTER — Encounter: Payer: Self-pay | Admitting: Family Medicine

## 2016-05-31 VITALS — BP 96/79 | HR 93 | Temp 99.0°F | Ht 65.0 in | Wt 153.0 lb

## 2016-05-31 DIAGNOSIS — M791 Myalgia, unspecified site: Secondary | ICD-10-CM | POA: Insufficient documentation

## 2016-05-31 DIAGNOSIS — G44209 Tension-type headache, unspecified, not intractable: Secondary | ICD-10-CM

## 2016-05-31 DIAGNOSIS — F411 Generalized anxiety disorder: Secondary | ICD-10-CM

## 2016-05-31 MED ORDER — ALPRAZOLAM 2 MG PO TABS: 2.0000 mg | ORAL_TABLET | Freq: Three times a day (TID) | ORAL | 5 refills | Status: DC

## 2016-05-31 MED ORDER — SILVER SULFADIAZINE 1 % EX CREA: 1.0000 "application " | TOPICAL_CREAM | Freq: Two times a day (BID) | CUTANEOUS | 2 refills | Status: DC

## 2016-05-31 MED ORDER — TRAMADOL HCL 50 MG PO TABS: 100.0000 mg | ORAL_TABLET | Freq: Three times a day (TID) | ORAL | 2 refills | Status: DC | PRN

## 2016-05-31 NOTE — Progress Notes (Signed)
   Subjective:    Patient ID: Ashlee Vasquez, female    DOB: [birth year], 36 y.o.   MRN: [id number]  HPI Here to follow up. She is doing well in general. She had ankle surgery last October and this is healing well. Her ROM is limited but there is far less pain. She still has daily muscle pains and uses Hydrocodone for this. Her anxiety is stable.    Review of Systems  Constitutional: Negative.   Respiratory: Negative.   Cardiovascular: Negative.   Musculoskeletal: Positive for arthralgias and myalgias.  Neurological: Negative.   Psychiatric/Behavioral: Negative for agitation, confusion, dysphoric mood and hallucinations. The patient is nervous/anxious.        Objective:   Physical Exam  Constitutional: She is oriented to person, place, and time. She appears well-developed and well-nourished.  Neck: No thyromegaly present.  Cardiovascular: Normal rate, regular rhythm, normal heart sounds and intact distal pulses.   Pulmonary/Chest: Effort normal and breath sounds normal.  Lymphadenopathy:    She has no cervical adenopathy.  Neurological: She is alert and oriented to person, place, and time.  Psychiatric: She has a normal mood and affect. Her behavior is normal. Thought content normal.          Assessment & Plan:  Her anxiety is stable, refiled the Xanax. Her headaches are stable. Her myalgia still bothers but she is not getting the exercise she used to. I encouraged her to exercise daily. Switch from Norco to Tramadol to use prn pain. Alysia Penna, MD

## 2016-05-31 NOTE — Patient Instructions (Signed)
WE NOW OFFER   Ashlee Vasquez's FAST TRACK!!!  SAME DAY Appointments for ACUTE CARE  Such as: Sprains, Injuries, cuts, abrasions, rashes, muscle pain, joint pain, back pain Colds, flu, sore throats, headache, allergies, cough, fever  Ear pain, sinus and eye infections Abdominal pain, nausea, vomiting, diarrhea, upset stomach Animal/insect bites  3 Easy Ways to Schedule: Walk-In Scheduling Call in scheduling Mychart Sign-up: [email or web address]         

## 2016-06-21 ENCOUNTER — Telehealth: Payer: Self-pay | Admitting: Family Medicine

## 2016-06-21 NOTE — Telephone Encounter (Signed)
I cannot write for any more narcotics for her. We did discuss this at the last OV. She has Tramadol to use instead

## 2016-06-21 NOTE — Telephone Encounter (Signed)
This was discontinued during last office visit.

## 2016-06-21 NOTE — Telephone Encounter (Signed)
° ° ° ° °  Pt call to ask for refill on the below med    HYDROCODONE

## 2016-06-21 NOTE — Telephone Encounter (Signed)
° ° ° °  Spoke with pt she aware that this rx will not be written anymore

## 2016-07-30 ENCOUNTER — Encounter: Payer: Self-pay | Admitting: Family Medicine

## 2016-07-31 ENCOUNTER — Other Ambulatory Visit: Payer: Self-pay | Admitting: Family Medicine

## 2016-07-31 MED ORDER — GABAPENTIN 100 MG PO CAPS: 100.0000 mg | ORAL_CAPSULE | Freq: Three times a day (TID) | ORAL | 2 refills | Status: DC

## 2016-07-31 MED ORDER — CYCLOBENZAPRINE HCL 10 MG PO TABS: 10.0000 mg | ORAL_TABLET | Freq: Three times a day (TID) | ORAL | 2 refills | Status: DC | PRN

## 2016-07-31 NOTE — Telephone Encounter (Signed)
Try Flexeril for a muscle relaxer, call in 10 mg to take tid prn muscle tightness, #60 with 2 rf. Also for pain try Gabapentin 100 mg tid, call in #90 with 2 rf

## 2016-10-05 ENCOUNTER — Encounter: Payer: Self-pay | Admitting: Family Medicine

## 2016-10-26 ENCOUNTER — Other Ambulatory Visit: Payer: Self-pay | Admitting: Family Medicine

## 2016-11-26 ENCOUNTER — Other Ambulatory Visit: Payer: Self-pay | Admitting: Family Medicine

## 2016-12-15 ENCOUNTER — Telehealth: Payer: Self-pay | Admitting: Family Medicine

## 2016-12-15 MED ORDER — GABAPENTIN 100 MG PO CAPS: 100.0000 mg | ORAL_CAPSULE | Freq: Three times a day (TID) | ORAL | 5 refills | Status: DC

## 2016-12-15 MED ORDER — ALPRAZOLAM 2 MG PO TABS: 2.0000 mg | ORAL_TABLET | Freq: Three times a day (TID) | ORAL | 5 refills | Status: DC

## 2016-12-15 NOTE — Telephone Encounter (Signed)
Call in both for #90 with 5 rf

## 2016-12-15 NOTE — Telephone Encounter (Signed)
Copied from [place] [id number]. Topic: Quick Communication - See Telephone Encounter >> Dec 15, 2016 10:25 AM Aurelio Brash B wrote: CRM for notification. See Telephone encounter for:  [facility]

## 2016-12-15 NOTE — Telephone Encounter (Signed)
Pt  Requesting  Refill  Of  Xanax  And  Gabapentin

## 2016-12-15 NOTE — Telephone Encounter (Signed)
Rx's sent and xanax was called into pt's pharmacy.

## 2016-12-15 NOTE — Telephone Encounter (Signed)
Sent to PCP for approval.  

## 2017-01-08 ENCOUNTER — Other Ambulatory Visit: Payer: Self-pay | Admitting: Family Medicine

## 2017-01-11 NOTE — Telephone Encounter (Signed)
Sent to PCP for approval. Last OV 05/31/2016. Rx was last refilled 10/27/2016 disp 60 with 2 refills.

## 2017-05-28 ENCOUNTER — Encounter: Payer: Self-pay | Admitting: Family Medicine

## 2017-05-28 MED ORDER — GABAPENTIN 100 MG PO CAPS: 100.0000 mg | ORAL_CAPSULE | Freq: Three times a day (TID) | ORAL | 5 refills | Status: DC

## 2017-05-28 NOTE — Telephone Encounter (Signed)
Last OV 05/16/18mlast filled 12/15/2016 90 5rf

## 2017-06-12 ENCOUNTER — Encounter: Payer: Self-pay | Admitting: Family Medicine

## 2017-06-14 ENCOUNTER — Other Ambulatory Visit: Payer: Self-pay | Admitting: Family Medicine

## 2017-06-14 NOTE — Telephone Encounter (Signed)
Have her see me OV to evaluate first

## 2017-06-21 ENCOUNTER — Other Ambulatory Visit: Payer: Self-pay | Admitting: Family Medicine

## 2017-06-21 NOTE — Telephone Encounter (Signed)
Last OV 05/31/2016   Last refilled 12/15/2016 disp 90 with 5 refills   Sent to PCP for approval

## 2017-06-21 NOTE — Telephone Encounter (Signed)
Call in #90 with 5 rf 

## 2017-06-24 ENCOUNTER — Other Ambulatory Visit: Payer: Self-pay | Admitting: Family Medicine

## 2017-07-11 ENCOUNTER — Other Ambulatory Visit: Payer: Self-pay | Admitting: Family Medicine

## 2017-07-11 ENCOUNTER — Encounter: Payer: Self-pay | Admitting: Family Medicine

## 2017-07-12 NOTE — Telephone Encounter (Signed)
Call in #60 with 5 rf 

## 2017-07-13 ENCOUNTER — Other Ambulatory Visit: Payer: Self-pay

## 2017-07-13 MED ORDER — CYCLOBENZAPRINE HCL 10 MG PO TABS: ORAL_TABLET | ORAL | 5 refills | Status: DC

## 2017-08-24 ENCOUNTER — Encounter: Payer: Self-pay | Admitting: Family Medicine

## 2017-08-24 ENCOUNTER — Ambulatory Visit (INDEPENDENT_AMBULATORY_CARE_PROVIDER_SITE_OTHER): Payer: Self-pay | Admitting: Family Medicine

## 2017-08-24 VITALS — BP 98/64 | HR 132 | Temp 98.3°F | Ht 65.0 in | Wt 175.0 lb

## 2017-08-24 DIAGNOSIS — M791 Myalgia, unspecified site: Secondary | ICD-10-CM

## 2017-08-24 LAB — CBC WITH DIFFERENTIAL/PLATELET
BASOS PCT: 0.2 % (ref 0.0–3.0)
Basophils Absolute: 0 10*3/uL (ref 0.0–0.1)
EOS ABS: 0 10*3/uL (ref 0.0–0.7)
Eosinophils Relative: 0.8 % (ref 0.0–5.0)
HCT: 36.8 % (ref 36.0–46.0)
HEMOGLOBIN: 12.7 g/dL (ref 12.0–15.0)
LYMPHS ABS: 2.9 10*3/uL (ref 0.7–4.0)
Lymphocytes Relative: 58.4 % — ABNORMAL HIGH (ref 12.0–46.0)
MCHC: 34.6 g/dL (ref 30.0–36.0)
MCV: 96.4 fl (ref 78.0–100.0)
MONO ABS: 0.4 10*3/uL (ref 0.1–1.0)
Monocytes Relative: 8.5 % (ref 3.0–12.0)
NEUTROS PCT: 32.1 % — AB (ref 43.0–77.0)
Neutro Abs: 1.6 10*3/uL (ref 1.4–7.7)
Platelets: 268 10*3/uL (ref 150.0–400.0)
RBC: 3.82 Mil/uL — ABNORMAL LOW (ref 3.87–5.11)
RDW: 13 % (ref 11.5–15.5)
WBC: 5 10*3/uL (ref 4.0–10.5)

## 2017-08-24 LAB — TSH: TSH: 1 u[IU]/mL (ref 0.35–4.50)

## 2017-08-24 LAB — BASIC METABOLIC PANEL
BUN: 11 mg/dL (ref 6–23)
CO2: 30 mEq/L (ref 19–32)
CREATININE: 0.86 mg/dL (ref 0.40–1.20)
Calcium: 9.3 mg/dL (ref 8.4–10.5)
Chloride: 103 mEq/L (ref 96–112)
GFR: 79.78 mL/min (ref >60.00)
GLUCOSE: 84 mg/dL (ref 70–99)
Potassium: 4.3 mEq/L (ref 3.5–5.1)
Sodium: 141 mEq/L (ref 135–145)

## 2017-08-24 MED ORDER — PROMETHAZINE HCL 25 MG PO TABS: ORAL_TABLET | ORAL | 5 refills | Status: AC

## 2017-08-24 MED ORDER — GABAPENTIN 300 MG PO CAPS: 300.0000 mg | ORAL_CAPSULE | Freq: Three times a day (TID) | ORAL | 3 refills | Status: DC

## 2017-08-24 NOTE — Telephone Encounter (Signed)
No the muscle relaxer is already at the full dose

## 2017-08-24 NOTE — Progress Notes (Signed)
   Subjective:    Patient ID: Ashlee Vasquez, female    DOB: 11/22/1980, 37 y.o.   MRN: [id number]  HPI Here for one week of diffuse muscle aches in both legs and both arms. Several days ago she had a painful cramp in the right calf, but this resolved. No swelling. No recent trauma.    Review of Systems  Constitutional: Negative.   Respiratory: Negative.   Cardiovascular: Negative.   Musculoskeletal: Positive for myalgias.  Neurological: Negative.        Objective:   Physical Exam  Constitutional: She is oriented to person, place, and time. She appears well-developed and well-nourished.  Cardiovascular: Normal rate, regular rhythm, normal heart sounds and intact distal pulses.  Pulmonary/Chest: Effort normal and breath sounds normal.  Musculoskeletal: Normal range of motion. She exhibits no edema or tenderness.  Neurological: She is alert and oriented to person, place, and time.          Assessment & Plan:  Myalgia. Check labs. Increase Gabapentin to 300 mg tid.  Alysia Penna, MD

## 2017-08-31 ENCOUNTER — Other Ambulatory Visit: Payer: Self-pay | Admitting: Family Medicine

## 2017-09-03 ENCOUNTER — Encounter: Payer: Self-pay | Admitting: Family Medicine

## 2017-09-05 MED ORDER — SILVER SULFADIAZINE 1 % EX CREA: 1.0000 "application " | TOPICAL_CREAM | Freq: Two times a day (BID) | CUTANEOUS | 2 refills | Status: DC

## 2017-09-05 NOTE — Telephone Encounter (Signed)
Call this in for 400 gm with 2 rf

## 2017-09-20 ENCOUNTER — Encounter: Payer: Self-pay | Admitting: Family Medicine

## 2017-09-24 NOTE — Telephone Encounter (Signed)
She can try OTC magnesium tablets 400 mg. Take 2 or 3 tabs at bedtime

## 2017-10-24 ENCOUNTER — Other Ambulatory Visit: Payer: Self-pay | Admitting: Family Medicine

## 2017-10-24 MED ORDER — GABAPENTIN 300 MG PO CAPS: 300.0000 mg | ORAL_CAPSULE | Freq: Three times a day (TID) | ORAL | 1 refills | Status: DC

## 2017-10-24 NOTE — Telephone Encounter (Signed)
Dr. Sarajane Jews please advise on refills.   No pending appts and last seen 2018

## 2017-11-28 ENCOUNTER — Other Ambulatory Visit: Payer: Self-pay | Admitting: Family Medicine

## 2017-12-04 ENCOUNTER — Encounter: Payer: Self-pay | Admitting: Family Medicine

## 2017-12-04 NOTE — Telephone Encounter (Signed)
Dr. Sarajane Jews please advise on the refill of the flexeril.  Thanks

## 2017-12-06 MED ORDER — CYCLOBENZAPRINE HCL 10 MG PO TABS: ORAL_TABLET | ORAL | 5 refills | Status: DC

## 2017-12-06 NOTE — Telephone Encounter (Signed)
Call in #60 with 5 rf 

## 2017-12-23 ENCOUNTER — Other Ambulatory Visit: Payer: Self-pay | Admitting: Family Medicine

## 2017-12-23 ENCOUNTER — Encounter: Payer: Self-pay | Admitting: Family Medicine

## 2017-12-25 NOTE — Telephone Encounter (Signed)
Last filled 06/22/17 Last Ov 08/24/17  Ok to fill?

## 2017-12-27 NOTE — Telephone Encounter (Signed)
Call in #90 with 5 rf 

## 2017-12-27 NOTE — Telephone Encounter (Signed)
Rx has been called in  

## 2018-03-04 ENCOUNTER — Ambulatory Visit: Payer: Self-pay | Admitting: Family Medicine

## 2018-04-14 ENCOUNTER — Other Ambulatory Visit: Payer: Self-pay | Admitting: Family Medicine

## 2018-04-15 ENCOUNTER — Encounter: Payer: Self-pay | Admitting: Family Medicine

## 2018-04-16 NOTE — Telephone Encounter (Signed)
Refill sent to the pharmacy yesterday

## 2018-05-10 ENCOUNTER — Encounter: Payer: Self-pay | Admitting: Family Medicine

## 2018-05-10 MED ORDER — HYDROCODONE-ACETAMINOPHEN 10-325 MG PO TABS: 1.0000 | ORAL_TABLET | ORAL | 0 refills | Status: AC | PRN

## 2018-05-10 NOTE — Telephone Encounter (Signed)
I sent in #30 Vicodin for her to use prn

## 2018-05-10 NOTE — Telephone Encounter (Signed)
Dr. Fry please advise. Thanks  

## 2018-05-13 ENCOUNTER — Encounter: Payer: Self-pay | Admitting: Family Medicine

## 2018-05-14 ENCOUNTER — Other Ambulatory Visit: Payer: Self-pay

## 2018-05-14 ENCOUNTER — Encounter: Payer: Self-pay | Admitting: Family Medicine

## 2018-05-14 ENCOUNTER — Ambulatory Visit (INDEPENDENT_AMBULATORY_CARE_PROVIDER_SITE_OTHER): Payer: Self-pay | Admitting: Family Medicine

## 2018-05-14 DIAGNOSIS — M7989 Other specified soft tissue disorders: Secondary | ICD-10-CM

## 2018-05-14 MED ORDER — FUROSEMIDE 20 MG PO TABS: 20.0000 mg | ORAL_TABLET | Freq: Every day | ORAL | 3 refills | Status: DC

## 2018-05-14 NOTE — Progress Notes (Signed)
Subjective:    Patient ID: Ashlee Vasquez, female    DOB: [birth year], 38 y.o.   MRN: [id number]  HPI Virtual Visit via Video Note  I connected with the patient on 05/14/18 at 10:00 AM EDT by a video enabled telemedicine application and verified that I am speaking with the correct person using two identifiers.  Location patient: home Location provider:work or home office Persons participating in the virtual visit: patient, provider  I discussed the limitations of evaluation and management by telemedicine and the availability of in person appointments. The patient expressed understanding and agreed to proceed.   HPI: Here to discuss some mild swelling in both hands and both feet that started about a month ago. This comes and goes. Sometimes she feels numbness or tingling in the hands. The skin never changes colors. There is not much pain. No recent changes in medications. She notes that she has gained some weight in the past few months. No SOB.    ROS: See pertinent positives and negatives per HPI.  Past Medical History:  Diagnosis Date  . Asthma   . [id number](300.7)     Past Surgical History:  Procedure Laterality Date  . left oopherectomy  2005  . ORIF ANKLE FRACTURE Left 10/21/2015   Procedure: OPEN REDUCTION INTERNAL FIXATION (ORIF) ANKLE FRACTURE REMOVAL OF HARDWARE;  Surgeon: Leandrew Koyanagi, MD;  Location: [place];  Service: Orthopedics;  Laterality: Left;  . TIBIA FRACTURE SURGERY     left    Family History  Problem Relation Age of Onset  . Diabetes Unknown        family hx  . Hypertension Unknown        family hx  . Cancer Unknown        lung     Current Outpatient Medications:  .  alprazolam (XANAX) 2 MG tablet, TAKE 1 TABLET BY MOUTH THREE TIMES A DAY, Disp: 90 tablet, Rfl: 5 .  cyclobenzaprine (FLEXERIL) 10 MG tablet, TAKE 1 TABLET 3 TIMES A DAY AS NEEDED FOR MUSCLE SPASM, Disp: 60 tablet, Rfl: 5 .  cyclobenzaprine (FLEXERIL) 10 MG tablet, TAKE 1 TABLET BY  MOUTH THREE TIMES A DAY AS NEEDED FOR MUSCLE SPASMS, Disp: 60 tablet, Rfl: 5 .  gabapentin (NEURONTIN) 300 MG capsule, TAKE 1 CAPSULE BY MOUTH THREE TIMES A DAY, Disp: 270 capsule, Rfl: 1 .  HYDROcodone-acetaminophen (NORCO) 10-325 MG tablet, Take 1 tablet by mouth every 4 (four) hours as needed for up to 5 days for moderate pain., Disp: 30 tablet, Rfl: 0 .  promethazine (PHENERGAN) 25 MG tablet, TAKE 1 TABLET BY MOUTH EVERY 4 HOURS AS NEEDED FOR NAUSEA, Disp: 60 tablet, Rfl: 5 .  silver sulfADIAZINE (SILVADENE) 1 % cream, Apply 1 application topically 2 (two) times daily., Disp: 400 g, Rfl: 2 .  albuterol (PROVENTIL HFA;VENTOLIN HFA) 108 (90 BASE) MCG/ACT inhaler, Inhale 2 puffs into the lungs every 4 (four) hours as needed for wheezing. (Patient not taking: Reported on 10/20/2015), Disp: 1 Inhaler, Rfl: 2 .  furosemide (LASIX) 20 MG tablet, Take 1 tablet (20 mg total) by mouth daily., Disp: 30 tablet, Rfl: 3  EXAM:  VITALS per patient if applicable:  GENERAL: alert, oriented, appears well and in no acute distress  HEENT: atraumatic, conjunttiva clear, no obvious abnormalities on inspection of external nose and ears  NECK: normal movements of the head and neck  LUNGS: on inspection no signs of respiratory distress, breathing rate appears normal, no obvious gross SOB, gasping or  wheezing  CV: no obvious cyanosis  MS: moves all visible extremities without noticeable abnormality  PSYCH/NEURO: pleasant and cooperative, no obvious depression or anxiety, speech and thought processing grossly intact  ASSESSMENT AND PLAN: Intermittent swelling in the hands and feet, this may be due to some fluid retention. Try Lasix 20 mg daily prn.  Alysia Penna, MD  Discussed the following assessment and plan:  No diagnosis found.     I discussed the assessment and treatment plan with the patient. The patient was provided an opportunity to ask questions and all were answered. The patient agreed with the  plan and demonstrated an understanding of the instructions.   The patient was advised to call back or seek an in-person evaluation if the symptoms worsen or if the condition fails to improve as anticipated.     Review of Systems     Objective:   Physical Exam        Assessment & Plan:

## 2018-05-21 ENCOUNTER — Encounter: Payer: Self-pay | Admitting: Family Medicine

## 2018-05-21 MED ORDER — CYCLOBENZAPRINE HCL 10 MG PO TABS: ORAL_TABLET | ORAL | 5 refills | Status: DC

## 2018-06-11 ENCOUNTER — Encounter: Payer: Self-pay | Admitting: Family Medicine

## 2018-06-12 NOTE — Telephone Encounter (Signed)
Spoke with pt and got her scheduled with Dr. Sarajane Jews 06/13/2018 at 10:30

## 2018-06-13 ENCOUNTER — Encounter: Payer: Self-pay | Admitting: Family Medicine

## 2018-06-13 ENCOUNTER — Other Ambulatory Visit: Payer: Self-pay

## 2018-06-13 ENCOUNTER — Ambulatory Visit (INDEPENDENT_AMBULATORY_CARE_PROVIDER_SITE_OTHER): Payer: Self-pay | Admitting: Family Medicine

## 2018-06-13 DIAGNOSIS — N946 Dysmenorrhea, unspecified: Secondary | ICD-10-CM

## 2018-06-13 MED ORDER — HYDROCODONE-ACETAMINOPHEN 10-325 MG PO TABS: 1.0000 | ORAL_TABLET | ORAL | 0 refills | Status: AC | PRN

## 2018-06-13 NOTE — Progress Notes (Signed)
Subjective:    Patient ID: Ashlee Vasquez, female    DOB: 1980/02/19, 38 y.o.   MRN: [id number]  HPI Virtual Visit via Video Note  I connected with the patient on 06/13/18 at 10:30 AM EDT by a video enabled telemedicine application and verified that I am speaking with the correct person using two identifiers.  Location patient: home Location provider:work or home office Persons participating in the virtual visit: patient, provider  I discussed the limitations of evaluation and management by telemedicine and the availability of in person appointments. The patient expressed understanding and agreed to proceed.   HPI: Here for help with painful menses. She has regular periods every 4 weeks, and they last 3-4 days. Bleeding is light to moderate, however she has severe cramps with them. She has tried Motrin and Tramadol, with no relief. The only things that helps is Norco. She has been using this on a fairly regular basis, and she asks for another 5 day supply today.    ROS: See pertinent positives and negatives per HPI.  Past Medical History:  Diagnosis Date  . Asthma   . KGURKYHC(623.7)     Past Surgical History:  Procedure Laterality Date  . left oopherectomy  2005  . ORIF ANKLE FRACTURE Left 10/21/2015   Procedure: OPEN REDUCTION INTERNAL FIXATION (ORIF) ANKLE FRACTURE REMOVAL OF HARDWARE;  Surgeon: Leandrew Koyanagi, MD;  Location: [place];  Service: Orthopedics;  Laterality: Left;  . TIBIA FRACTURE SURGERY     left    Family History  Problem Relation Age of Onset  . Diabetes Unknown        family hx  . Hypertension Unknown        family hx  . Cancer Unknown        lung     Current Outpatient Medications:  .  alprazolam (XANAX) 2 MG tablet, TAKE 1 TABLET BY MOUTH THREE TIMES A DAY, Disp: 90 tablet, Rfl: 5 .  cyclobenzaprine (FLEXERIL) 10 MG tablet, TAKE 1 TABLET 3 TIMES A DAY AS NEEDED FOR MUSCLE SPASM, Disp: 60 tablet, Rfl: 5 .  furosemide (LASIX) 20 MG tablet, Take 1  tablet (20 mg total) by mouth daily., Disp: 30 tablet, Rfl: 3 .  gabapentin (NEURONTIN) 300 MG capsule, TAKE 1 CAPSULE BY MOUTH THREE TIMES A DAY, Disp: 270 capsule, Rfl: 1 .  promethazine (PHENERGAN) 25 MG tablet, TAKE 1 TABLET BY MOUTH EVERY 4 HOURS AS NEEDED FOR NAUSEA, Disp: 60 tablet, Rfl: 5 .  silver sulfADIAZINE (SILVADENE) 1 % cream, Apply 1 application topically 2 (two) times daily., Disp: 400 g, Rfl: 2 .  albuterol (PROVENTIL HFA;VENTOLIN HFA) 108 (90 BASE) MCG/ACT inhaler, Inhale 2 puffs into the lungs every 4 (four) hours as needed for wheezing. (Patient not taking: Reported on 10/20/2015), Disp: 1 Inhaler, Rfl: 2 .  HYDROcodone-acetaminophen (NORCO) 10-325 MG tablet, Take 1 tablet by mouth every 4 (four) hours as needed for up to 5 days for moderate pain., Disp: 30 tablet, Rfl: 0  EXAM:  VITALS per patient if applicable:  GENERAL: alert, oriented, appears well and in no acute distress  HEENT: atraumatic, conjunttiva clear, no obvious abnormalities on inspection of external nose and ears  NECK: normal movements of the head and neck  LUNGS: on inspection no signs of respiratory distress, breathing rate appears normal, no obvious gross SOB, gasping or wheezing  CV: no obvious cyanosis  MS: moves all visible extremities without noticeable abnormality  PSYCH/NEURO: pleasant and cooperative, no obvious  depression or anxiety, speech and thought processing grossly intact  ASSESSMENT AND PLAN: Dysmenorrhea, we will provide her with Norco #30 today. I told her that we need to enroll her in a pain management program here at our clinic, and that I could not provide her with any more pain medication unless it is part of a PM program. She agreed to set up an in person PMV here within the next month.  Alysia Penna, MD   Discussed the following assessment and plan:  No diagnosis found.     I discussed the assessment and treatment plan with the patient. The patient was provided an  opportunity to ask questions and all were answered. The patient agreed with the plan and demonstrated an understanding of the instructions.   The patient was advised to call back or seek an in-person evaluation if the symptoms worsen or if the condition fails to improve as anticipated.     Review of Systems     Objective:   Physical Exam        Assessment & Plan:

## 2018-07-09 ENCOUNTER — Other Ambulatory Visit: Payer: Self-pay | Admitting: Family Medicine

## 2018-07-09 ENCOUNTER — Encounter: Payer: Self-pay | Admitting: Family Medicine

## 2018-07-09 NOTE — Telephone Encounter (Signed)
Dr. Fry please advise. Thanks  

## 2018-07-10 NOTE — Telephone Encounter (Signed)
Call in #90 with 5 rf 

## 2018-07-11 NOTE — Telephone Encounter (Signed)
Refill has been called to the pharmacy and left on the VM.  

## 2018-08-07 ENCOUNTER — Encounter: Payer: Self-pay | Admitting: Obstetrics and Gynecology

## 2018-08-07 ENCOUNTER — Encounter: Payer: Self-pay | Admitting: Family Medicine

## 2018-08-09 ENCOUNTER — Other Ambulatory Visit: Payer: Self-pay | Admitting: Family Medicine

## 2018-08-20 ENCOUNTER — Encounter: Payer: Self-pay | Admitting: Obstetrics and Gynecology

## 2018-10-30 ENCOUNTER — Encounter: Payer: Self-pay | Admitting: Family Medicine

## 2018-10-30 MED ORDER — CYCLOBENZAPRINE HCL 10 MG PO TABS: ORAL_TABLET | ORAL | 5 refills | Status: DC

## 2018-10-30 MED ORDER — GABAPENTIN 300 MG PO CAPS: 600.0000 mg | ORAL_CAPSULE | Freq: Three times a day (TID) | ORAL | 1 refills | Status: DC

## 2018-10-30 NOTE — Telephone Encounter (Signed)
She is already at the maximum dose of Flexeril. I doubled the dose of her Gabapentin to take 2 capsules TID

## 2018-11-01 NOTE — Telephone Encounter (Signed)
See my answer. No OV needed

## 2018-12-06 ENCOUNTER — Encounter: Payer: Self-pay | Admitting: Family Medicine

## 2018-12-06 NOTE — Telephone Encounter (Signed)
She is not due for Xanax refills until 01-10-19. No refills are needed yet for Gabapentin. Go ahead and call in silver sulfadiazine cream. As for the cough, she will need a virtual OV to discuss this further

## 2018-12-06 NOTE — Telephone Encounter (Signed)
Ok for refill? 

## 2018-12-10 MED ORDER — SILVER SULFADIAZINE 1 % EX CREA: 1.0000 "application " | TOPICAL_CREAM | Freq: Two times a day (BID) | CUTANEOUS | 2 refills | Status: AC

## 2018-12-11 ENCOUNTER — Other Ambulatory Visit: Payer: Self-pay | Admitting: Family Medicine

## 2018-12-16 ENCOUNTER — Telehealth: Payer: Self-pay

## 2018-12-16 ENCOUNTER — Encounter: Payer: Self-pay | Admitting: Family Medicine

## 2018-12-16 NOTE — Telephone Encounter (Signed)
Called pt no answer °

## 2018-12-16 NOTE — Telephone Encounter (Signed)
Copied from [place] [id number]. Topic: General - Inquiry >> Dec 11, 2018  8:33 AM Mathis Bud wrote: Reason for CRM: Patient mother called regarding patients legs.  Mother states that randomly patients legs just give out. Completely stop working.  No other symptoms.   Call back [phone]

## 2018-12-16 NOTE — Telephone Encounter (Signed)
Copied from [place] [phone]. Topic: General - Inquiry >> Dec 11, 2018  8:33 AM Mathis Bud wrote: Reason for CRM: Patient mother called regarding patients legs.  Mother states that randomly patients legs just give out. Completely stop working.  No other symptoms.   Call back [phone]

## 2018-12-17 NOTE — Telephone Encounter (Signed)
Called pt no answer. Left Mychart message.

## 2018-12-17 NOTE — Telephone Encounter (Signed)
Left message to return phone call.

## 2018-12-17 NOTE — Telephone Encounter (Signed)
Make her a virtual OV so we can discuss this

## 2018-12-18 NOTE — Telephone Encounter (Signed)
Spoke to pt and she stated she does not have any children. I think there may have been a mix up in pt charts. Also, pt stated that she had a cough but declined a virtual visit. Pt stated that she as wanting a referral for GI and I advised pt that she wil need a VV since this was not discuss with Dr.Fry per pt. I advised pt to call if she needed anything. Pt declined and stated that she does not have insurance and will call every GI until she gets one that doesn't need a referral.

## 2018-12-19 NOTE — Telephone Encounter (Signed)
Noted  

## 2018-12-23 ENCOUNTER — Telehealth: Payer: Self-pay | Admitting: Family Medicine

## 2018-12-23 NOTE — Telephone Encounter (Signed)
Copied from [place] [id number] [phone]. Topic: General - Inquiry >> Dec 23, 2018  5:17 PM Ashlee Vasquez wrote: Reason for CRM:  Patient is requesting a Xray . Please advise

## 2018-12-24 NOTE — Telephone Encounter (Signed)
Left message to return phone call.

## 2018-12-25 NOTE — Telephone Encounter (Signed)
Spoke to pt and she declined appt and stated that she is ok and doesn't need to come in.

## 2019-01-03 ENCOUNTER — Encounter: Payer: Self-pay | Admitting: Family Medicine

## 2019-01-03 MED ORDER — ALPRAZOLAM 2 MG PO TABS: 2.0000 mg | ORAL_TABLET | Freq: Three times a day (TID) | ORAL | 5 refills | Status: DC

## 2019-01-03 NOTE — Telephone Encounter (Signed)
This was sent in  

## 2019-01-03 NOTE — Telephone Encounter (Signed)
Last filled 07/11/2018 Last OV 06/13/2018  Ok to fill?

## 2019-02-08 ENCOUNTER — Encounter: Payer: Self-pay | Admitting: Family Medicine

## 2019-02-10 ENCOUNTER — Other Ambulatory Visit: Payer: Self-pay | Admitting: *Deleted

## 2019-02-10 DIAGNOSIS — F419 Anxiety disorder, unspecified: Secondary | ICD-10-CM

## 2019-02-10 MED ORDER — CITALOPRAM HYDROBROMIDE 20 MG PO TABS: 20.0000 mg | ORAL_TABLET | Freq: Every day | ORAL | 3 refills | Status: DC

## 2019-02-10 NOTE — Telephone Encounter (Signed)
Call in Citalopram 20 mg daily, #30 with 3 rf. Report back to [place] in 3-4 weeks

## 2019-02-26 ENCOUNTER — Other Ambulatory Visit: Payer: Self-pay | Admitting: Family Medicine

## 2019-03-05 ENCOUNTER — Other Ambulatory Visit: Payer: Self-pay | Admitting: Family Medicine

## 2019-03-05 DIAGNOSIS — F419 Anxiety disorder, unspecified: Secondary | ICD-10-CM

## 2019-03-06 ENCOUNTER — Encounter: Payer: Self-pay | Admitting: Family Medicine

## 2019-03-07 NOTE — Telephone Encounter (Signed)
Yes we can do that. Tell her to wait closer to her move date and send [place] anther reminder then

## 2019-03-14 ENCOUNTER — Encounter: Payer: Self-pay | Admitting: Family Medicine

## 2019-03-14 DIAGNOSIS — F419 Anxiety disorder, unspecified: Secondary | ICD-10-CM

## 2019-03-17 ENCOUNTER — Encounter: Payer: Self-pay | Admitting: Family Medicine

## 2019-03-17 MED ORDER — CYCLOBENZAPRINE HCL 10 MG PO TABS: ORAL_TABLET | ORAL | 0 refills | Status: AC

## 2019-03-17 MED ORDER — ALPRAZOLAM 2 MG PO TABS: 2.0000 mg | ORAL_TABLET | Freq: Three times a day (TID) | ORAL | 0 refills | Status: DC

## 2019-03-17 MED ORDER — GABAPENTIN 300 MG PO CAPS: 600.0000 mg | ORAL_CAPSULE | Freq: Three times a day (TID) | ORAL | 0 refills | Status: AC

## 2019-03-17 MED ORDER — CITALOPRAM HYDROBROMIDE 20 MG PO TABS: 20.0000 mg | ORAL_TABLET | Freq: Every day | ORAL | 0 refills | Status: AC

## 2019-03-17 NOTE — Telephone Encounter (Signed)
I tried to send this electronically but the system would not allow me. So we will print it out and fax it to the pharmacy

## 2019-03-17 NOTE — Telephone Encounter (Signed)
Done

## 2019-03-19 MED ORDER — ALPRAZOLAM 2 MG PO TABS: 2.0000 mg | ORAL_TABLET | Freq: Three times a day (TID) | ORAL | 0 refills | Status: AC

## 2019-03-19 NOTE — Telephone Encounter (Signed)
I sent this in electronically

## 2019-05-07 ENCOUNTER — Encounter: Payer: Self-pay | Admitting: Family Medicine

## 2019-05-12 NOTE — Telephone Encounter (Signed)
She has enough Citalopram to last until 06-17-19. She needs to get these medications from a new provider where she lives

## 2019-06-05 ENCOUNTER — Other Ambulatory Visit: Payer: Self-pay | Admitting: Family Medicine

## 2019-06-05 DIAGNOSIS — F419 Anxiety disorder, unspecified: Secondary | ICD-10-CM

## 2019-06-27 ENCOUNTER — Other Ambulatory Visit: Payer: Self-pay | Admitting: Family Medicine

## 2019-06-27 DIAGNOSIS — F419 Anxiety disorder, unspecified: Secondary | ICD-10-CM

## 2019-07-21 ENCOUNTER — Encounter: Payer: Self-pay | Admitting: Family Medicine

## 2019-07-22 NOTE — Telephone Encounter (Signed)
Please advise 

## 2019-07-22 NOTE — Telephone Encounter (Signed)
No more refills, she is no longer my patient
